# Patient Record
Sex: Female | Born: 1960 | Race: Black or African American | Hispanic: No | Marital: Single | Smoking: Never smoker
Health system: Southern US, Community
[De-identification: ages and names within clinical notes are randomized; demographics above are authoritative.]

## PROBLEM LIST (undated history)

## (undated) DIAGNOSIS — M25569 Pain in unspecified knee: Secondary | ICD-10-CM

## (undated) DIAGNOSIS — Z8639 Personal history of other endocrine, nutritional and metabolic disease: Secondary | ICD-10-CM

## (undated) DIAGNOSIS — B9689 Other specified bacterial agents as the cause of diseases classified elsewhere: Secondary | ICD-10-CM

## (undated) DIAGNOSIS — M79606 Pain in leg, unspecified: Secondary | ICD-10-CM

## (undated) DIAGNOSIS — B3731 Acute candidiasis of vulva and vagina: Secondary | ICD-10-CM

## (undated) DIAGNOSIS — T7840XA Allergy, unspecified, initial encounter: Secondary | ICD-10-CM

## (undated) DIAGNOSIS — M549 Dorsalgia, unspecified: Secondary | ICD-10-CM

## (undated) DIAGNOSIS — Z86010 Personal history of colonic polyps: Secondary | ICD-10-CM

## (undated) DIAGNOSIS — N760 Acute vaginitis: Secondary | ICD-10-CM

## (undated) DIAGNOSIS — E785 Hyperlipidemia, unspecified: Secondary | ICD-10-CM

## (undated) DIAGNOSIS — D649 Anemia, unspecified: Secondary | ICD-10-CM

## (undated) DIAGNOSIS — B373 Candidiasis of vulva and vagina: Secondary | ICD-10-CM

## (undated) DIAGNOSIS — M4800 Spinal stenosis, site unspecified: Secondary | ICD-10-CM

## (undated) DIAGNOSIS — M1990 Unspecified osteoarthritis, unspecified site: Secondary | ICD-10-CM

## (undated) HISTORY — DX: Unspecified osteoarthritis, unspecified site: M19.90

## (undated) HISTORY — DX: Spinal stenosis, site unspecified: M48.00

## (undated) HISTORY — PX: COLONOSCOPY: SHX174

## (undated) HISTORY — DX: Other specified bacterial agents as the cause of diseases classified elsewhere: N76.0

## (undated) HISTORY — PX: WISDOM TOOTH EXTRACTION: SHX21

## (undated) HISTORY — DX: Dorsalgia, unspecified: M54.9

## (undated) HISTORY — DX: Personal history of other endocrine, nutritional and metabolic disease: Z86.39

## (undated) HISTORY — DX: Acute candidiasis of vulva and vagina: B37.31

## (undated) HISTORY — DX: Candidiasis of vulva and vagina: B37.3

## (undated) HISTORY — DX: Allergy, unspecified, initial encounter: T78.40XA

## (undated) HISTORY — DX: Personal history of colonic polyps: Z86.010

## (undated) HISTORY — PX: POLYPECTOMY: SHX149

## (undated) HISTORY — DX: Pain in leg, unspecified: M79.606

## (undated) HISTORY — DX: Other specified bacterial agents as the cause of diseases classified elsewhere: B96.89

## (undated) HISTORY — DX: Pain in unspecified knee: M25.569

## (undated) HISTORY — DX: Hyperlipidemia, unspecified: E78.5

## (undated) HISTORY — DX: Anemia, unspecified: D64.9

---

## 2011-05-23 ENCOUNTER — Other Ambulatory Visit: Payer: Self-pay | Admitting: Internal Medicine

## 2011-05-23 DIAGNOSIS — Z1231 Encounter for screening mammogram for malignant neoplasm of breast: Secondary | ICD-10-CM

## 2011-06-13 ENCOUNTER — Ambulatory Visit
Admission: RE | Admit: 2011-06-13 | Discharge: 2011-06-13 | Disposition: A | Discharge status: Home / Self Care | Payer: 59 | Source: Ambulatory Visit | Attending: Internal Medicine | Admitting: Internal Medicine

## 2011-06-13 DIAGNOSIS — Z1231 Encounter for screening mammogram for malignant neoplasm of breast: Secondary | ICD-10-CM

## 2012-03-17 ENCOUNTER — Telehealth: Payer: Self-pay | Admitting: Obstetrics and Gynecology

## 2012-03-17 NOTE — Telephone Encounter (Signed)
Triage/appt

## 2012-03-17 NOTE — Telephone Encounter (Signed)
Lm on vm tc rgd msg 

## 2012-03-19 ENCOUNTER — Encounter: Payer: Self-pay | Admitting: Obstetrics and Gynecology

## 2012-03-19 ENCOUNTER — Ambulatory Visit (INDEPENDENT_AMBULATORY_CARE_PROVIDER_SITE_OTHER): Payer: BC Managed Care – PPO | Admitting: Obstetrics and Gynecology

## 2012-03-19 VITALS — BP 92/60 | Ht 64.0 in | Wt 163.0 lb

## 2012-03-19 DIAGNOSIS — A499 Bacterial infection, unspecified: Secondary | ICD-10-CM

## 2012-03-19 DIAGNOSIS — N898 Other specified noninflammatory disorders of vagina: Secondary | ICD-10-CM

## 2012-03-19 DIAGNOSIS — B9689 Other specified bacterial agents as the cause of diseases classified elsewhere: Secondary | ICD-10-CM

## 2012-03-19 DIAGNOSIS — L293 Anogenital pruritus, unspecified: Secondary | ICD-10-CM

## 2012-03-19 DIAGNOSIS — N760 Acute vaginitis: Secondary | ICD-10-CM

## 2012-03-19 LAB — POCT WET PREP (WET MOUNT)
Clue Cells Wet Prep Whiff POC: NEGATIVE
WBC, Wet Prep HPF POC: NEGATIVE
pH: 4

## 2012-03-19 MED ORDER — METRONIDAZOLE 500 MG PO TABS: 500.0000 mg | ORAL_TABLET | Freq: Two times a day (BID) | ORAL | Status: AC

## 2012-03-19 NOTE — Progress Notes (Signed)
S: Patient presented with hx of vaginal irritation since commencing exercising a few weeks. O: Normal affect, alert and oriented x 3      Lungs: Bilaterally Clear      CV: RRR      Abdomen soft and non tender all 4 quadrants      Bowel sound: normal      Bowel motions: regular      U/A: Neg      Speculum Examination: External Genitalia: normal, Vaginal wall pink and perfused not irritation, Cx pink no inflammation.      Wet Prep: Ph 4.0 , clue cells = BV P. Tx Flagyl 500mg s po x 7 days     Return PRN

## 2012-04-01 ENCOUNTER — Ambulatory Visit (INDEPENDENT_AMBULATORY_CARE_PROVIDER_SITE_OTHER): Payer: BC Managed Care – PPO | Admitting: Obstetrics and Gynecology

## 2012-04-01 ENCOUNTER — Encounter: Payer: Self-pay | Admitting: Obstetrics and Gynecology

## 2012-04-01 VITALS — BP 100/62 | Wt 162.0 lb

## 2012-04-01 DIAGNOSIS — B373 Candidiasis of vulva and vagina: Secondary | ICD-10-CM

## 2012-04-01 DIAGNOSIS — N898 Other specified noninflammatory disorders of vagina: Secondary | ICD-10-CM

## 2012-04-01 LAB — POCT WET PREP (WET MOUNT)
Whiff Test: NEGATIVE
pH: 4.5

## 2012-04-01 MED ORDER — TERCONAZOLE 0.4 % VA CREA: 1.0000 | TOPICAL_CREAM | Freq: Every day | VAGINAL | Status: AC

## 2012-04-01 NOTE — Progress Notes (Signed)
Color: WHITE Odor: no Itching:yes Thin:yes Thick:no Fever:no Dyspareunia:no Hx PID:no HX STD:no Pelvic Pain:no Desires Gc/CT:no Desires HIV,RPR,HbsAG:no

## 2012-04-01 NOTE — Progress Notes (Signed)
51 YO recently treated for BV returns with complaints of vaginal itching states she's had these symptoms since before her treatment for  BV.  O: Pelvic: EGBUS-mild erythema,  vagina-large clumpy yellow discharge, cervix-no lesions, uterus-normal size, adnexae-no tenderness      Wet prep:  pH-4.5   whiff-negative  yeast +++  A: Candida Vaginitis     Recent BV Treatment     H/O Vitamin D Deficiency  P:  Terazol 7 Vaginal Cream # 1 tube 1 appl. pv qhs x 7 days no refills       Vitamin D 1000 IU qd       Perineal hygiene      RTO-as scheduled or prn  Milderd Manocchio, PA-C

## 2012-05-25 ENCOUNTER — Encounter: Payer: Self-pay | Admitting: Obstetrics and Gynecology

## 2012-05-25 ENCOUNTER — Ambulatory Visit (INDEPENDENT_AMBULATORY_CARE_PROVIDER_SITE_OTHER): Payer: BC Managed Care – PPO | Admitting: Obstetrics and Gynecology

## 2012-05-25 ENCOUNTER — Telehealth: Payer: Self-pay | Admitting: Obstetrics and Gynecology

## 2012-05-25 VITALS — BP 90/70 | Wt 162.5 lb

## 2012-05-25 DIAGNOSIS — B9689 Other specified bacterial agents as the cause of diseases classified elsewhere: Secondary | ICD-10-CM

## 2012-05-25 DIAGNOSIS — A499 Bacterial infection, unspecified: Secondary | ICD-10-CM

## 2012-05-25 DIAGNOSIS — N898 Other specified noninflammatory disorders of vagina: Secondary | ICD-10-CM

## 2012-05-25 DIAGNOSIS — B373 Candidiasis of vulva and vagina: Secondary | ICD-10-CM

## 2012-05-25 DIAGNOSIS — N760 Acute vaginitis: Secondary | ICD-10-CM

## 2012-05-25 LAB — POCT WET PREP (WET MOUNT)

## 2012-05-25 LAB — POCT OSOM BVBLUE TEST: Bacterial Vaginosis: POSITIVE

## 2012-05-25 MED ORDER — NONFORMULARY OR COMPOUNDED ITEM: Status: DC

## 2012-05-25 MED ORDER — FLUCONAZOLE 150 MG PO TABS: 150.0000 mg | ORAL_TABLET | Freq: Once | ORAL | Status: DC

## 2012-05-25 MED ORDER — TINIDAZOLE 500 MG PO TABS: ORAL_TABLET | ORAL | Status: DC

## 2012-05-25 NOTE — Progress Notes (Signed)
Vaginal discharge: clearwatery Itching / Burning: yes Fever: no  Symptoms have been present for several days. Has used over-the-counter treatment: no. Was seen previously and given rx. Associated symptoms:  Pelvic pain: no       Dyspareunia: no     Odor:  none  History of STD:  no history of PID, STD's STD screen no  Vaginal Discharge/Discomfort/Itching  Subjective:   Sherine Cortese is an 51 y.o. woman who presents c/o vaginal itching ongoing for many weeks. Has seen Dr Su Hilt and was treated with PO ATB. Then was seen by EP and was treated for yeast with vaginal cream. Symptoms are persistent. Hygiene reviewed: uses Always pantyliner daily  Objective: discharge Thick and white Vaginal discharge: pH 5.5 Vaginal lesions:  none  Wet prep: hyphae +++ OSOM BV: positive OSOM Trichomonas:  not done  Assessment: Yeast vaginitis and BV  Plan: Diflucan 150 mg today, Thursday and next week Tindamax Boric acid suppository 2xweekly for 4 weeks Continue probiotics Counseling: D/C Always  Follow-up: PRN  Ranessa Kosta A MD 05/25/2012 1:33 PM

## 2012-05-25 NOTE — Telephone Encounter (Signed)
Spoke with pt rgd msg pt c/o vaginal itching and irritation wants eval today pt has appt 05/25/12 at 12:40 with SR pt voice understanding

## 2012-05-25 NOTE — Progress Notes (Deleted)
Subjective:    Angela Koch is a 51 y.o. female, G3P0, who presents for vaginal itching and irritation. Seen recently for same symptoms but states never really went away.    The following portions of the patient's history were reviewed and updated as appropriate: allergies, current medications, past family history.  Review of Systems Pertinent items are noted in HPI. Breast:Negative for breast lump,nipple discharge or nipple retraction Gastrointestinal: Negative for abdominal pain, change in bowel habits or rectal bleeding Urinary:negative   Objective:    BP 90/70  Wt 162 lb 8 oz (73.71 kg)  LMP 05/14/2012    Weight:  Wt Readings from Last 1 Encounters:  05/25/12 162 lb 8 oz (73.71 kg)          BMI: There is no height on file to calculate BMI.  General Appearance: Alert, appropriate appearance for age. No acute distress GYN exam:   Assessment:    {diagnoses; exam gyn:13148}    Plan:    {gyn plan:315269::"mammogram","pap smear","return annually or prn"}     Butler Denmark PMD

## 2012-06-23 ENCOUNTER — Ambulatory Visit (INDEPENDENT_AMBULATORY_CARE_PROVIDER_SITE_OTHER): Payer: BC Managed Care – PPO | Admitting: Obstetrics and Gynecology

## 2012-06-23 ENCOUNTER — Encounter: Payer: Self-pay | Admitting: Obstetrics and Gynecology

## 2012-06-23 VITALS — BP 114/68 | Resp 16 | Ht 64.0 in | Wt 157.0 lb

## 2012-06-23 DIAGNOSIS — D219 Benign neoplasm of connective and other soft tissue, unspecified: Secondary | ICD-10-CM

## 2012-06-23 DIAGNOSIS — Z01419 Encounter for gynecological examination (general) (routine) without abnormal findings: Secondary | ICD-10-CM

## 2012-06-23 DIAGNOSIS — Z124 Encounter for screening for malignant neoplasm of cervix: Secondary | ICD-10-CM

## 2012-06-23 DIAGNOSIS — D259 Leiomyoma of uterus, unspecified: Secondary | ICD-10-CM

## 2012-06-23 NOTE — Progress Notes (Signed)
Patient ID: Arline Ketter, female   DOB: [birth year], 51 y.o.   MRN: [id number] Contraception partner vasectomy Last pap 10/2012wnl Last Mammo 05/2011 wnl per pt Last Colonoscopy never Last Dexa Scan never Primary MD Dr. Allyne Gee Abuse at Home none  No complaints  Filed Vitals:   06/23/12 1007  BP: 114/68  Resp: 16   ROS: noncontributory  Physical Examination: General appearance - alert, well appearing, and in no distress Neck - supple, no significant adenopathy Chest - clear to auscultation, no wheezes, rales or rhonchi, symmetric air entry Heart - normal rate and regular rhythm Abdomen - soft, nontender, nondistended, no masses or organomegaly Breasts - breasts appear normal, no suspicious masses, no skin or nipple changes or axillary nodes Pelvic - normal external genitalia, vulva, vagina, cervix, bulky uterus and adnexa ? Secondary to difficult to differentiate b/n fibroids and ovaries Back exam - no CVAT Extremities - no edema, redness or tenderness in the calves or thighs  A/P AEX in 25yr Next available for pelvic u/s secondary to fibroids and eval ovaries Needs mammo - pt says she will schedule Screening colonoscopy starting next yr

## 2012-06-24 LAB — PAP IG W/ RFLX HPV ASCU

## 2012-07-28 ENCOUNTER — Ambulatory Visit (INDEPENDENT_AMBULATORY_CARE_PROVIDER_SITE_OTHER): Payer: BC Managed Care – PPO | Admitting: Obstetrics and Gynecology

## 2012-07-28 ENCOUNTER — Ambulatory Visit (INDEPENDENT_AMBULATORY_CARE_PROVIDER_SITE_OTHER): Payer: BC Managed Care – PPO

## 2012-07-28 ENCOUNTER — Encounter: Payer: Self-pay | Admitting: Obstetrics and Gynecology

## 2012-07-28 ENCOUNTER — Other Ambulatory Visit: Payer: Self-pay | Admitting: Obstetrics and Gynecology

## 2012-07-28 VITALS — BP 110/70 | Ht 64.0 in | Wt 167.0 lb

## 2012-07-28 DIAGNOSIS — D518 Other vitamin B12 deficiency anemias: Secondary | ICD-10-CM

## 2012-07-28 DIAGNOSIS — N83209 Unspecified ovarian cyst, unspecified side: Secondary | ICD-10-CM

## 2012-07-28 DIAGNOSIS — D219 Benign neoplasm of connective and other soft tissue, unspecified: Secondary | ICD-10-CM

## 2012-07-28 DIAGNOSIS — E559 Vitamin D deficiency, unspecified: Secondary | ICD-10-CM

## 2012-07-28 DIAGNOSIS — D259 Leiomyoma of uterus, unspecified: Secondary | ICD-10-CM

## 2012-07-28 DIAGNOSIS — Z139 Encounter for screening, unspecified: Secondary | ICD-10-CM

## 2012-07-28 LAB — CBC
MCHC: 31.4 g/dL (ref 30.0–36.0)
RDW: 16.8 % — ABNORMAL HIGH (ref 11.5–15.5)

## 2012-07-28 LAB — TSH: TSH: 0.977 u[IU]/mL (ref 0.350–4.500)

## 2012-07-28 NOTE — Addendum Note (Signed)
Addended by: Marla Roe A on: 07/28/2012 12:20 PM   Modules accepted: Orders

## 2012-07-28 NOTE — Progress Notes (Signed)
Here to f/u u/s.  She reports dysmenorrhea controlled with ibuprofen.  Filed Vitals:   07/28/12 1121  BP: 110/70   U/S - Ut 13.4cm x 8.4cm x 9.9cm, nl lt ovary , rt ovary with simple cyst 3.5cm, 4 fibroids (2.8cm, 5.0cm, 5.4cm, 3.7cm)  A/P Fibroids - asymptomatic will cont to take ibuprofen U/s in to f/u ovarian cyst Labs today - cbc, tsh, vit D (reports h/o vit D def)

## 2012-07-29 LAB — VITAMIN D 25 HYDROXY (VIT D DEFICIENCY, FRACTURES): Vit D, 25-Hydroxy: 15 ng/mL — ABNORMAL LOW (ref 30–89)

## 2012-08-19 ENCOUNTER — Encounter: Payer: Self-pay | Admitting: Obstetrics and Gynecology

## 2012-08-19 ENCOUNTER — Telehealth: Payer: Self-pay | Admitting: Obstetrics and Gynecology

## 2012-08-19 ENCOUNTER — Ambulatory Visit (INDEPENDENT_AMBULATORY_CARE_PROVIDER_SITE_OTHER): Payer: BC Managed Care – PPO | Admitting: Obstetrics and Gynecology

## 2012-08-19 VITALS — BP 112/70 | HR 72 | Wt 164.0 lb

## 2012-08-19 DIAGNOSIS — N898 Other specified noninflammatory disorders of vagina: Secondary | ICD-10-CM

## 2012-08-19 MED ORDER — METRONIDAZOLE 500 MG PO TABS: ORAL_TABLET | ORAL | Status: DC

## 2012-08-19 MED ORDER — ERGOCALCIFEROL 1.25 MG (50000 UT) PO CAPS: 50000.0000 [IU] | ORAL_CAPSULE | ORAL | Status: DC

## 2012-08-19 MED ORDER — VITAMIN D3 1.25 MG (50000 UT) PO CAPS: 1.0000 | ORAL_CAPSULE | ORAL | Status: DC

## 2012-08-19 MED ORDER — FLUCONAZOLE 150 MG PO TABS: 150.0000 mg | ORAL_TABLET | Freq: Once | ORAL | Status: AC

## 2012-08-19 NOTE — Progress Notes (Signed)
Vaginal discharge: brownthin mucoid, thick mucoid Itching / Burning: yes Fever: no  Symptoms have been present for 2 days. PT STATES SHE HAS BEEN HAVING RECURRENT YEAST INFECTIONS SINCE July Has used over-the-counter treatment: no Associated symptoms:  Pelvic pain: no       Dyspareunia: no     Odor:  yes  History of STD:  no history of PID, STD's STD screen:declined

## 2012-08-19 NOTE — Patient Instructions (Addendum)
Take an Iron supplement twice a day for 6 weeks  After 8 weeks of taking the Vitamin D, return to have your Vitamin D rechecked,  if it is then normal,  you may be able to maintain your Vitamin D level (once normal) with 1000 IU of Vtiamin D daily

## 2012-08-19 NOTE — Progress Notes (Signed)
51 YO complains of recurrent vaginitis symptoms since July. Treatments have seemingly worked until after her menses, then symptoms recur. Patient is anemic and has a low vitamin D   O:  Pelvic: EGBUS-wnl, vagina-moderate grey discharge, cervix/uterus/adnexae-normal  Wet Prep:  pH-5.0,  whiff-negative, positive clue cells,  OSOM-(trich)-negative  A: Bacterial Vaginosis-recurrent vs persisten     Vitamin D Deficiency     Anemia  P:  Metronidazole 500 mg  #26 bid x 7 days then once weekly x 12 weeks no refills       Perineal hygiene       OTC  iron supplementation bid x 6 weeks      Vitamin D 50,000 units twice weekly x 8 weeks       Repeat Vitamin D 25-H & CBC in 8 weeks       Diflucan 150 mg #1 1 po stat 1 refill       RTO-as scheduled or prn      Dejour Vos, PA-C

## 2012-08-24 ENCOUNTER — Telehealth: Payer: Self-pay

## 2012-08-24 ENCOUNTER — Telehealth: Payer: Self-pay | Admitting: Obstetrics and Gynecology

## 2012-08-24 MED ORDER — FLUCONAZOLE 150 MG PO TABS: 150.0000 mg | ORAL_TABLET | Freq: Once | ORAL | Status: DC

## 2012-08-24 NOTE — Telephone Encounter (Signed)
Spoke to pt. Re: Vitamin D. Level of 15. She states she thought she was supposed to be taking her Vitamin D 1 q week x 12 weeks, although the way it was prescribed was twice weekly x 8 weeks. I also reminded pt to be taking iron bid, which she says she is. Melody Comas A

## 2012-08-24 NOTE — Telephone Encounter (Signed)
Pt saw EP on 08/19/12

## 2012-08-24 NOTE — Telephone Encounter (Signed)
Tc to pt concerning rx Diflucan. Pt states she lost her rx and need another one call in. Will call rx to pt Pharmacy.

## 2012-10-13 ENCOUNTER — Telehealth: Payer: Self-pay | Admitting: Obstetrics and Gynecology

## 2012-10-13 ENCOUNTER — Other Ambulatory Visit: Payer: Self-pay | Admitting: Obstetrics and Gynecology

## 2012-10-13 DIAGNOSIS — Z1231 Encounter for screening mammogram for malignant neoplasm of breast: Secondary | ICD-10-CM

## 2012-10-13 NOTE — Telephone Encounter (Signed)
Ar pt 

## 2012-10-18 ENCOUNTER — Ambulatory Visit
Admission: RE | Admit: 2012-10-18 | Discharge: 2012-10-18 | Disposition: A | Discharge status: Home / Self Care | Payer: BC Managed Care – PPO | Source: Ambulatory Visit | Attending: Obstetrics and Gynecology | Admitting: Obstetrics and Gynecology

## 2012-10-18 DIAGNOSIS — Z1231 Encounter for screening mammogram for malignant neoplasm of breast: Secondary | ICD-10-CM

## 2012-10-19 ENCOUNTER — Other Ambulatory Visit: Payer: Self-pay

## 2012-10-19 DIAGNOSIS — E559 Vitamin D deficiency, unspecified: Secondary | ICD-10-CM

## 2012-10-19 NOTE — Telephone Encounter (Signed)
Spoke to pt about getting her scheduled for recheck Vitamin D level. Angela Koch A

## 2012-10-22 ENCOUNTER — Other Ambulatory Visit: Payer: BC Managed Care – PPO

## 2012-10-22 DIAGNOSIS — E559 Vitamin D deficiency, unspecified: Secondary | ICD-10-CM

## 2012-10-23 LAB — VITAMIN D 25 HYDROXY (VIT D DEFICIENCY, FRACTURES): Vit D, 25-Hydroxy: 24 ng/mL — ABNORMAL LOW (ref 30–89)

## 2012-11-01 ENCOUNTER — Telehealth: Payer: Self-pay

## 2012-11-01 ENCOUNTER — Other Ambulatory Visit: Payer: Self-pay

## 2012-11-01 DIAGNOSIS — E559 Vitamin D deficiency, unspecified: Secondary | ICD-10-CM

## 2012-11-01 MED ORDER — VITAMIN D3 1.25 MG (50000 UT) PO CAPS: 1.0000 | ORAL_CAPSULE | ORAL | Status: DC

## 2012-11-01 NOTE — Telephone Encounter (Signed)
Called pt to let her know that Vitamin D Rx has been refilled. She should now take it once a week and we will recheck her in 12 weeks. Pt understands. Recall entered and future order entered. Melody Comas A

## 2012-11-02 ENCOUNTER — Telehealth: Payer: Self-pay

## 2012-11-02 NOTE — Telephone Encounter (Signed)
Spoke to pharmacist to say that it is ok to substitute Vit D3 with Vit D2 50,000 units. , per AR. Melody Comas A

## 2013-07-13 ENCOUNTER — Other Ambulatory Visit: Payer: Self-pay | Admitting: Family Medicine

## 2013-07-13 DIAGNOSIS — N6310 Unspecified lump in the right breast, unspecified quadrant: Secondary | ICD-10-CM

## 2013-07-27 ENCOUNTER — Ambulatory Visit
Admission: RE | Admit: 2013-07-27 | Discharge: 2013-07-27 | Disposition: A | Discharge status: Home / Self Care | Payer: BC Managed Care – PPO | Source: Ambulatory Visit | Attending: Family Medicine | Admitting: Family Medicine

## 2013-07-27 DIAGNOSIS — N6310 Unspecified lump in the right breast, unspecified quadrant: Secondary | ICD-10-CM

## 2013-11-01 ENCOUNTER — Other Ambulatory Visit: Payer: Self-pay

## 2013-11-01 DIAGNOSIS — Z1231 Encounter for screening mammogram for malignant neoplasm of breast: Secondary | ICD-10-CM

## 2013-11-11 ENCOUNTER — Ambulatory Visit
Admission: RE | Admit: 2013-11-11 | Discharge: 2013-11-11 | Disposition: A | Discharge status: Home / Self Care | Payer: BC Managed Care – PPO | Source: Ambulatory Visit

## 2013-11-11 DIAGNOSIS — Z1231 Encounter for screening mammogram for malignant neoplasm of breast: Secondary | ICD-10-CM

## 2014-01-04 ENCOUNTER — Encounter: Payer: BC Managed Care – PPO | Admitting: Gastroenterology

## 2014-01-17 ENCOUNTER — Ambulatory Visit (AMBULATORY_SURGERY_CENTER): Payer: Self-pay

## 2014-01-17 VITALS — Ht 64.0 in | Wt 179.8 lb

## 2014-01-17 DIAGNOSIS — Z1211 Encounter for screening for malignant neoplasm of colon: Secondary | ICD-10-CM

## 2014-01-17 MED ORDER — NA SULFATE-K SULFATE-MG SULF 17.5-3.13-1.6 GM/177ML PO SOLN: ORAL | Status: DC

## 2014-01-17 NOTE — Progress Notes (Signed)
Per pt, allergic to soy milk-causes nose to itch.No allergies to egg products.Pt not taking any weight loss meds or using  O2 at home.Pt has never been sedated before.

## 2014-01-19 ENCOUNTER — Encounter: Payer: Self-pay | Admitting: Internal Medicine

## 2014-01-31 ENCOUNTER — Encounter: Payer: Self-pay | Admitting: Internal Medicine

## 2014-01-31 ENCOUNTER — Ambulatory Visit (AMBULATORY_SURGERY_CENTER): Payer: BC Managed Care – PPO | Admitting: Internal Medicine

## 2014-01-31 VITALS — BP 110/73 | HR 48 | Temp 96.1°F | Resp 18 | Ht 64.0 in | Wt 179.0 lb

## 2014-01-31 DIAGNOSIS — D126 Benign neoplasm of colon, unspecified: Secondary | ICD-10-CM

## 2014-01-31 DIAGNOSIS — K5730 Diverticulosis of large intestine without perforation or abscess without bleeding: Secondary | ICD-10-CM

## 2014-01-31 DIAGNOSIS — Z860100 Personal history of colon polyps, unspecified: Secondary | ICD-10-CM

## 2014-01-31 DIAGNOSIS — Z1211 Encounter for screening for malignant neoplasm of colon: Secondary | ICD-10-CM

## 2014-01-31 HISTORY — DX: Personal history of colon polyps, unspecified: Z86.0100

## 2014-01-31 HISTORY — DX: Personal history of colonic polyps: Z86.010

## 2014-01-31 MED ORDER — SODIUM CHLORIDE 0.9 % IV SOLN: 500.0000 mL | INTRAVENOUS | Status: DC

## 2014-01-31 NOTE — Progress Notes (Signed)
Stable to RR 

## 2014-01-31 NOTE — Progress Notes (Signed)
Called to room to assist during endoscopic procedure.  Patient ID and intended procedure confirmed with present staff. Received instructions for my participation in the procedure from the performing physician.  

## 2014-01-31 NOTE — Op Note (Signed)
[place]  [address] [place], [address]   COLONOSCOPY PROCEDURE REPORT  PATIENT: Angela Koch, Angela Koch  MR#: [id number] BIRTHDATE: Nov 16, 1960 , 53  yrs. old GENDER: Female ENDOSCOPIST: Gatha Mayer, MD, [facility] REFERRED FE:OFHQR [facility], M.D. PROCEDURE DATE:  01/31/2014 PROCEDURE:   Colonoscopy with biopsy First Screening Colonoscopy - Avg.  risk and is 50 yrs.  old or older Yes.  Prior Negative Screening - Now for repeat screening. N/A  History of Adenoma - Now for follow-up colonoscopy & has been > or = to 3 yrs.  N/A  Polyps Removed Today? Yes. ASA CLASS:   Class I INDICATIONS:average risk screening and first colonoscopy. MEDICATIONS: propofol (Diprivan) 250mg  IV, MAC sedation, administered by CRNA, and These medications were titrated to patient response per physician's verbal order  DESCRIPTION OF PROCEDURE:   After the risks benefits and alternatives of the procedure were thoroughly explained, informed consent was obtained.  A digital rectal exam revealed no abnormalities of the rectum.   The LB [id number]  endoscope was introduced through the anus and advanced to the cecum, which was identified by both the appendix and ileocecal valve. No adverse events experienced.   The quality of the prep was excellent using Suprep  The instrument was then slowly withdrawn as the colon was fully examined.  COLON FINDINGS: A sessile polyp measuring 3 mm in size was found at the ileocecal valve.  A polypectomy was performed with cold forceps.  The resection was complete and the polyp tissue was completely retrieved.   There was mild scattered diverticulosis noted throughout the entire examined colon.   The colon mucosa was otherwise normal.   A right colon retroflexion was performed. Retroflexed views revealed no abnormalities. The time to cecum=4 minutes 39 seconds.  Withdrawal time=11 minutes 42 seconds.  The scope was withdrawn and the procedure  completed. COMPLICATIONS: There were no complications.  ENDOSCOPIC IMPRESSION: 1.   Sessile polyp measuring 3 mm in size was found at the ileocecal valve; polypectomy was performed with cold forceps 2.   There was mild diverticulosis noted throughout the entire examined colon 3.   The colon mucosa was otherwise normal - excellent prep - first colonoscopy  RECOMMENDATIONS: Timing of repeat colonoscopy will be determined by pathology findings.   eSigned:  Gatha Mayer, MD, [facility] 01/31/2014 8:36 AM   cc: Glendale Chard, MD and The Patient

## 2014-01-31 NOTE — Patient Instructions (Addendum)
I found and removed one tiny polyp. It looks benign.  You also have a condition called diverticulosis - common and not usually a problem. Please read the handout provided.  I will let you know pathology results and when to have another routine colonoscopy by mail.  I appreciate the opportunity to care for you. Gatha Mayer, MD, [facility]   Discharge instructions given with verbal understanding. Handouts on polyps and diverticulosis. Resume previous medications. YOU HAD AN ENDOSCOPIC PROCEDURE TODAY AT [facility]: Refer to the procedure report that was given to you for any specific questions about what was found during the examination.  If the procedure report does not answer your questions, please call your gastroenterologist to clarify.  If you requested that your care partner not be given the details of your procedure findings, then the procedure report has been included in a sealed envelope for you to review at your convenience later.  YOU SHOULD EXPECT: Some feelings of bloating in the abdomen. Passage of more gas than usual.  Walking can help get rid of the air that was put into your GI tract during the procedure and reduce the bloating. If you had a lower endoscopy (such as a colonoscopy or flexible sigmoidoscopy) you may notice spotting of blood in your stool or on the toilet paper. If you underwent a bowel prep for your procedure, then you may not have a normal bowel movement for a few days.  DIET: Your first meal following the procedure should be a light meal and then it is ok to progress to your normal diet.  A half-sandwich or bowl of soup is an example of a good first meal.  Heavy or fried foods are harder to digest and may make you feel nauseous or bloated.  Likewise meals heavy in dairy and vegetables can cause extra gas to form and this can also increase the bloating.  Drink plenty of fluids but you should avoid alcoholic beverages for 24 hours.  ACTIVITY: Your care  partner should take you home directly after the procedure.  You should plan to take it easy, moving slowly for the rest of the day.  You can resume normal activity the day after the procedure however you should NOT DRIVE or use heavy machinery for 24 hours (because of the sedation medicines used during the test).    SYMPTOMS TO REPORT IMMEDIATELY: A gastroenterologist can be reached at any hour.  During normal business hours, 8:30 AM to 5:00 PM Monday through Friday, call [phone].  After hours and on weekends, please call the GI answering service at [phone] who will take a message and have the physician on call contact you.   Following lower endoscopy (colonoscopy or flexible sigmoidoscopy):  Excessive amounts of blood in the stool  Significant tenderness or worsening of abdominal pains  Swelling of the abdomen that is new, acute  Fever of 100F or higher  FOLLOW UP: If any biopsies were taken you will be contacted by phone or by letter within the next 1-3 weeks.  Call your gastroenterologist if you have not heard about the biopsies in 3 weeks.  Our staff will call the home number listed on your records the next business day following your procedure to check on you and address any questions or concerns that you may have at that time regarding the information given to you following your procedure. This is a courtesy call and so if there is no answer at the home number  and we have not heard from you through the emergency physician on call, we will assume that you have returned to your regular daily activities without incident.  SIGNATURES/CONFIDENTIALITY: You and/or your care partner have signed paperwork which will be entered into your electronic medical record.  These signatures attest to the fact that that the information above on your After Visit Summary has been reviewed and is understood.  Full responsibility of the confidentiality of this discharge information lies with you and/or  your care-partner.

## 2014-02-01 ENCOUNTER — Telehealth: Payer: Self-pay

## 2014-02-01 NOTE — Telephone Encounter (Signed)
  Follow up Call-  Call back number 01/31/2014  Post procedure Call Back phone  # [phone]  Permission to leave phone message Yes     Patient questions:  Do you have a fever, pain , or abdominal swelling? no Pain Score  0 *  Have you tolerated food without any problems? yes  Have you been able to return to your normal activities? yes  Do you have any questions about your discharge instructions: Diet   no Medications  no Follow up visit  no  Do you have questions or concerns about your Care? no  Actions: * If pain score is 4 or above: No action needed, pain <4.   Per the pt she c/o nasal congestion yesterday and this am.  I advised her that we administer O2 via nasal canula and sometimes pt do c/o nasal congestion after the procedure.  Pt said it could be allergies too.  She hoped it was not a cold.  I told her common cold was going around.Marland Kitchenibuprofen had a cold last week.  To call back if needed. Maw

## 2014-02-07 ENCOUNTER — Encounter: Payer: Self-pay | Admitting: Internal Medicine

## 2014-02-07 NOTE — Progress Notes (Signed)
Quick Note:  3 mm tubular adenoma Repeat colonoscopy 2020 ______

## 2014-06-26 ENCOUNTER — Encounter: Payer: Self-pay | Admitting: Internal Medicine

## 2014-07-14 ENCOUNTER — Other Ambulatory Visit: Payer: Self-pay | Admitting: Obstetrics and Gynecology

## 2015-01-25 ENCOUNTER — Other Ambulatory Visit: Payer: Self-pay

## 2015-01-25 DIAGNOSIS — Z1231 Encounter for screening mammogram for malignant neoplasm of breast: Secondary | ICD-10-CM

## 2015-01-31 ENCOUNTER — Ambulatory Visit
Admission: RE | Admit: 2015-01-31 | Discharge: 2015-01-31 | Disposition: A | Discharge status: Home / Self Care | Payer: BLUE CROSS/BLUE SHIELD | Source: Ambulatory Visit

## 2015-01-31 DIAGNOSIS — Z1231 Encounter for screening mammogram for malignant neoplasm of breast: Secondary | ICD-10-CM

## 2015-02-02 ENCOUNTER — Other Ambulatory Visit: Payer: Self-pay | Admitting: Obstetrics and Gynecology

## 2015-02-02 DIAGNOSIS — R928 Other abnormal and inconclusive findings on diagnostic imaging of breast: Secondary | ICD-10-CM

## 2015-02-09 ENCOUNTER — Ambulatory Visit
Admission: RE | Admit: 2015-02-09 | Discharge: 2015-02-09 | Disposition: A | Discharge status: Home / Self Care | Payer: 59 | Source: Ambulatory Visit | Attending: Obstetrics and Gynecology | Admitting: Obstetrics and Gynecology

## 2015-02-09 DIAGNOSIS — R928 Other abnormal and inconclusive findings on diagnostic imaging of breast: Secondary | ICD-10-CM

## 2016-02-14 ENCOUNTER — Other Ambulatory Visit: Payer: Self-pay | Admitting: Obstetrics and Gynecology

## 2016-02-14 DIAGNOSIS — Z1231 Encounter for screening mammogram for malignant neoplasm of breast: Secondary | ICD-10-CM

## 2016-02-21 ENCOUNTER — Ambulatory Visit
Admission: RE | Admit: 2016-02-21 | Discharge: 2016-02-21 | Disposition: A | Discharge status: Home / Self Care | Payer: 59 | Source: Ambulatory Visit | Attending: Obstetrics and Gynecology | Admitting: Obstetrics and Gynecology

## 2016-02-21 DIAGNOSIS — Z1231 Encounter for screening mammogram for malignant neoplasm of breast: Secondary | ICD-10-CM

## 2016-02-27 ENCOUNTER — Other Ambulatory Visit: Payer: Self-pay | Admitting: Obstetrics and Gynecology

## 2016-02-27 DIAGNOSIS — R928 Other abnormal and inconclusive findings on diagnostic imaging of breast: Secondary | ICD-10-CM

## 2016-03-04 ENCOUNTER — Ambulatory Visit
Admission: RE | Admit: 2016-03-04 | Discharge: 2016-03-04 | Disposition: A | Discharge status: Home / Self Care | Payer: 59 | Source: Ambulatory Visit | Attending: Obstetrics and Gynecology | Admitting: Obstetrics and Gynecology

## 2016-03-04 DIAGNOSIS — R928 Other abnormal and inconclusive findings on diagnostic imaging of breast: Secondary | ICD-10-CM

## 2017-06-24 ENCOUNTER — Other Ambulatory Visit: Payer: Self-pay | Admitting: Obstetrics and Gynecology

## 2017-06-24 DIAGNOSIS — Z1231 Encounter for screening mammogram for malignant neoplasm of breast: Secondary | ICD-10-CM

## 2017-07-22 ENCOUNTER — Ambulatory Visit
Admission: RE | Admit: 2017-07-22 | Discharge: 2017-07-22 | Disposition: A | Discharge status: Home / Self Care | Payer: 59 | Source: Ambulatory Visit | Attending: Obstetrics and Gynecology | Admitting: Obstetrics and Gynecology

## 2017-07-22 DIAGNOSIS — Z1231 Encounter for screening mammogram for malignant neoplasm of breast: Secondary | ICD-10-CM

## 2018-06-28 ENCOUNTER — Encounter (HOSPITAL_COMMUNITY): Payer: Self-pay | Admitting: Emergency Medicine

## 2018-06-28 ENCOUNTER — Emergency Department (HOSPITAL_COMMUNITY): Payer: Worker's Compensation

## 2018-06-28 ENCOUNTER — Other Ambulatory Visit: Payer: Self-pay

## 2018-06-28 ENCOUNTER — Emergency Department (HOSPITAL_COMMUNITY)
Admission: EM | Admit: 2018-06-28 | Discharge: 2018-06-28 | Disposition: A | Discharge status: Home / Self Care | Payer: Worker's Compensation | Attending: Emergency Medicine | Admitting: Emergency Medicine

## 2018-06-28 DIAGNOSIS — Y929 Unspecified place or not applicable: Secondary | ICD-10-CM | POA: Insufficient documentation

## 2018-06-28 DIAGNOSIS — Z79899 Other long term (current) drug therapy: Secondary | ICD-10-CM | POA: Diagnosis not present

## 2018-06-28 DIAGNOSIS — S0990XA Unspecified injury of head, initial encounter: Secondary | ICD-10-CM | POA: Insufficient documentation

## 2018-06-28 DIAGNOSIS — W01198A Fall on same level from slipping, tripping and stumbling with subsequent striking against other object, initial encounter: Secondary | ICD-10-CM | POA: Diagnosis not present

## 2018-06-28 DIAGNOSIS — Y939 Activity, unspecified: Secondary | ICD-10-CM | POA: Insufficient documentation

## 2018-06-28 DIAGNOSIS — W19XXXA Unspecified fall, initial encounter: Secondary | ICD-10-CM

## 2018-06-28 DIAGNOSIS — Y990 Civilian activity done for income or pay: Secondary | ICD-10-CM | POA: Insufficient documentation

## 2018-06-28 NOTE — Discharge Instructions (Signed)

## 2018-06-28 NOTE — ED Notes (Signed)
Pt requesting an additional assessment based on "cold like symptoms."

## 2018-06-28 NOTE — ED Provider Notes (Signed)
Patient placed in Quick Look pathway, seen and evaluated   Chief Complaint: fall  HPI: Angela Koch is a 57 y.o. female who presents to the ED s/p fall. Patient reports she slipped and fell at home and the right side of her head hit the window ledge. She denies LOC or neck pain. She does have pain on the right side of her head where she hit.   ROS: Neuro: headache  Physical Exam:  BP 109/75 (BP Location: Right Arm)   Pulse 60   Temp 98.9 F (37.2 C) (Oral)   Resp 16   SpO2 100%    Gen: No distress  Neuro: Awake and Alert  Skin: Warm and dry  HEENT: right side of head behind ear tender with palpation  Initiation of care has begun. The patient has been counseled on the process, plan, and necessity for staying for the completion/evaluation, and the remainder of the medical screening examination    Ashley Murrain, NP 06/28/18 1623    Julianne Rice, MD 06/30/18 [phone]

## 2018-06-28 NOTE — ED Triage Notes (Signed)
Pt st's she was at work this am and slipped falling backwards and hitting her head on the floor.  Pt denies LOC.  C/o pain in back of head

## 2018-06-28 NOTE — ED Provider Notes (Signed)
[facility] EMERGENCY DEPARTMENT Provider Note   CSN: [id number] Arrival date & time: 06/28/18  1601     History   Chief Complaint Chief Complaint  Patient presents with  . Fall  . Head Injury    HPI Angela Koch is a 57 y.o. female with past medical history of anemia, who presents today for evaluation after fall.  She reports this morning she was at work when she slipped and fell striking the right side of her head on a windowsill and then the floor.  She says the windowsill is down low.  She denies feeling lightheaded or dizzy before hand.  She did not pass out.  She does not take any blood thinning medications.  She denies any weakness, numbness, or tingling.  At the time of discharge patient voiced concern for a cold with nasal pressure.  She did not strike her face when she fell.  HPI  Past Medical History:  Diagnosis Date  . Anemia    H/O  . BV (bacterial vaginosis)    H/O  . H/O vitamin D deficiency   . Personal history of colonic polyp - adenoma 01/31/2014  . Vaginal yeast infection    RECURRENT    Patient Active Problem List   Diagnosis Date Noted  . Personal history of colonic polyp - adenoma 01/31/2014    Past Surgical History:  Procedure Laterality Date  . WISDOM TOOTH EXTRACTION       OB History    Gravida  3   Para  0   Term      Preterm      AB      Living  0     SAB      TAB      Ectopic      Multiple      Live Births               Home Medications    Prior to Admission medications   Medication Sig Start Date End Date Taking? Authorizing Provider  Cholecalciferol (VITAMIN D3) 50000 UNITS CAPS Take 1 capsule by mouth once a week. 11/01/12   Everett Graff, MD  diphenhydrAMINE (BENADRYL) 25 mg capsule Take 25 mg by mouth 2 (two) times daily.    [provider]  ibuprofen (ADVIL,MOTRIN) 200 MG tablet Take 200 mg by mouth every 6 (six) hours as needed.    [provider]  NONFORMULARY OR  COMPOUNDED ITEM Boric acid suppository 600 mg per vagina 2 x weekly for 4 weeks then as needed 05/25/12   Delsa Bern, MD    Family History Family History  Problem Relation Age of Onset  . Diabetes Father   . Diabetes Sister   . Lung cancer Paternal Aunt   . Breast cancer Maternal Grandmother   . Heart disease Maternal Grandmother   . Breast cancer Paternal Grandmother   . Diabetes Paternal Grandmother   . Breast cancer Maternal Aunt   . Breast cancer Maternal Aunt   . Breast cancer Maternal Aunt   . Breast cancer Maternal Aunt   . Breast cancer Cousin     Social History Social History   Tobacco Use  . Smoking status: Never Smoker  . Smokeless tobacco: Never Used  Substance Use Topics  . Alcohol use: No  . Drug use: No     Allergies   Soy allergy   Review of Systems Review of Systems  Constitutional: Negative for chills and fever.  Musculoskeletal: Positive  for neck pain.  Neurological: Positive for headaches. Negative for dizziness, syncope, weakness and numbness.  All other systems reviewed and are negative.    Physical Exam Updated Vital Signs BP 118/72 (BP Location: Right Arm)   Pulse 69   Temp 98.9 F (37.2 C) (Oral)   Resp 16   Ht 5\' 4"  (1.626 m)   Wt 77.1 kg   SpO2 99%   BMI 29.18 kg/m   Physical Exam  Constitutional: She appears well-developed and well-nourished. No distress.  HENT:  Head: Normocephalic.  Mouth/Throat: Oropharynx is clear and moist.  There is ecchymosis and edema behind the right ear.  There are no obvious raccoon eyes.  No hemotympanum bilaterally.  Eyes: Conjunctivae are normal. Right eye exhibits no discharge. Left eye exhibits no discharge. No scleral icterus.  Neck: Normal range of motion.  Cardiovascular: Normal rate and regular rhythm.  Pulmonary/Chest: Effort normal. No stridor. No respiratory distress.  Abdominal: She exhibits no distension.  Musculoskeletal: She exhibits no edema or deformity.  Neurological:  She is alert. She exhibits normal muscle tone.  Mental Status:  Alert, oriented, thought content appropriate, able to give a coherent history. Speech fluent without evidence of aphasia. Able to follow 2 step commands without difficulty.  Cranial Nerves:  II:  Peripheral visual fields grossly normal, pupils equal, round, reactive to light III,IV, VI: ptosis not present, extra-ocular motions intact bilaterally  V,VII: smile symmetric, facial light touch sensation equal VIII: hearing grossly normal to voice  X: uvula elevates symmetrically  XI: bilateral shoulder shrug symmetric and strong XII: midline tongue extension without fassiculations Motor:  Normal tone. 5/5 in upper and lower extremities bilaterally including strong and equal grip strength and dorsiflexion/plantar flexion Gait: normal gait and balance   Skin: Skin is warm and dry. She is not diaphoretic.  Psychiatric: She has a normal mood and affect. Her behavior is normal.  Nursing note and vitals reviewed.        ED Treatments / Results  Labs (all labs ordered are listed, but only abnormal results are displayed) Labs Reviewed - No data to display  EKG None  Radiology Ct Head Wo Contrast  Result Date: 06/28/2018 CLINICAL DATA:  57 year old who slipped and fell at home, striking the RIGHT side of her head on a window ledge. Patient denies loss of consciousness. She complains of headache. Initial encounter. EXAM: CT HEAD WITHOUT CONTRAST CT CERVICAL SPINE WITHOUT CONTRAST TECHNIQUE: Multidetector CT imaging of the head and cervical spine was performed following the standard protocol without intravenous contrast. Multiplanar CT image reconstructions of the cervical spine were also generated. COMPARISON:  None. FINDINGS: CT HEAD FINDINGS Brain: Ventricular system normal in size and appearance for age. No mass lesion. No midline shift. No acute hemorrhage or hematoma. No extra-axial fluid collections. No evidence of acute  infarction. No focal brain parenchymal abnormalities. Vascular: Minimal to mild BILATERAL carotid siphon atherosclerosis. No hyperdense vessel. Skull: No skull fracture or other focal osseous abnormality involving the skull. Sinuses/Orbits: Visualized paranasal sinuses, bilateral mastoid air cells and bilateral middle ear cavities well-aerated. Visualized orbits and globes normal in appearance. Other: None. CT CERVICAL SPINE FINDINGS Alignment: Anatomic POSTERIOR alignment. Reversal of the usual cervical lordosis. Skull base and vertebrae: No fractures identified involving the cervical spine. Facet joints anatomically aligned throughout with scattered degenerative changes. Coronal reformatted images demonstrate an intact craniocervical junction, intact dens and intact lateral masses throughout. Soft tissues and spinal canal: No evidence of paraspinous or spinal canal hematoma. No evidence of  spinal stenosis. Disc levels: Moderate disc space narrowing at C2-3, mild disc space narrowing at C5-6 and moderate disc space narrowing at C6-7. LEFT to facet and uncinate hypertrophy at C3-4 account for mild LEFT foraminal stenosis. LEFT facet degenerative changes at C4-5 account for mild LEFT foraminal stenosis. LEFT facet and uncinate hypertrophy account for mild to moderate LEFT foraminal stenosis at C6-7. Upper chest: Visualized lung apices clear. Visualized superior mediastinum normal. Other: None. IMPRESSION: CT Head: 1. No acute intracranial abnormality. CT Cervical Spine: 1. No cervical spine fractures identified. 2. 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 Electronically Signed   By: Evangeline Dakin M.D.   On: 06/28/2018 19:27   Ct Cervical Spine Wo Contrast  Result Date: 06/28/2018 CLINICAL DATA:  57 year old who slipped and fell at home, striking the RIGHT side of her head on a window ledge. Patient denies loss of consciousness. She complains of headache. Initial encounter.  EXAM: CT HEAD WITHOUT CONTRAST CT CERVICAL SPINE WITHOUT CONTRAST TECHNIQUE: Multidetector CT imaging of the head and cervical spine was performed following the standard protocol without intravenous contrast. Multiplanar CT image reconstructions of the cervical spine were also generated. COMPARISON:  None. FINDINGS: CT HEAD FINDINGS Brain: Ventricular system normal in size and appearance for age. No mass lesion. No midline shift. No acute hemorrhage or hematoma. No extra-axial fluid collections. No evidence of acute infarction. No focal brain parenchymal abnormalities. Vascular: Minimal to mild BILATERAL carotid siphon atherosclerosis. No hyperdense vessel. Skull: No skull fracture or other focal osseous abnormality involving the skull. Sinuses/Orbits: Visualized paranasal sinuses, bilateral mastoid air cells and bilateral middle ear cavities well-aerated. Visualized orbits and globes normal in appearance. Other: None. CT CERVICAL SPINE FINDINGS Alignment: Anatomic POSTERIOR alignment. Reversal of the usual cervical lordosis. Skull base and vertebrae: No fractures identified involving the cervical spine. Facet joints anatomically aligned throughout with scattered degenerative changes. Coronal reformatted images demonstrate an intact craniocervical junction, intact dens and intact lateral masses throughout. Soft tissues and spinal canal: No evidence of paraspinous or spinal canal hematoma. No evidence of spinal stenosis. Disc levels: Moderate disc space narrowing at C2-3, mild disc space narrowing at C5-6 and moderate disc space narrowing at C6-7. LEFT to facet and uncinate hypertrophy at C3-4 account for mild LEFT foraminal stenosis. LEFT facet degenerative changes at C4-5 account for mild LEFT foraminal stenosis. LEFT facet and uncinate hypertrophy account for mild to moderate LEFT foraminal stenosis at C6-7. Upper chest: Visualized lung apices clear. Visualized superior mediastinum normal. Other: None. IMPRESSION:  CT Head: 1. No acute intracranial abnormality. CT Cervical Spine: 1. No cervical spine fractures identified. 2. 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 Electronically Signed   By: Evangeline Dakin M.D.   On: 06/28/2018 19:27    Procedures Procedures (including critical care time)  Medications Ordered in ED Medications - No data to display   Initial Impression / Assessment and Plan / ED Course  I have reviewed the triage vital signs and the nursing notes.  Pertinent labs & imaging results that were available during my care of the patient were reviewed by me and considered in my medical decision making (see chart for details).    Patient presents today for evaluation after a fall at work where she struck the right side of her head on a windowsill.  She did not pass out or lose consciousness.  On exam patient has swelling and ecchymosis behind her right ear.  While this is most likely from  the fall and direct blow unable to apply [place] CT head criteria as this may represent a basilar skull fracture.  Discussed this with patient who elected for CT scan.  CT scan head and neck were obtained without evidence of acute abnormalities.  Patient was informed that she most likely has a concussion.  She is given a work note and concussion clinic follow-up as needed.  She did not have any deterioration while in the emergency room.  This patient was discussed with Dr. Francia Greaves who agreed with general plan.  Patient was informed of all CT results including incidental and chronic findings.    Return precautions were discussed with patient who states their understanding.  At the time of discharge patient denied any unaddressed complaints or concerns.  Patient is agreeable for discharge home.   Final Clinical Impressions(s) / ED Diagnoses   Final diagnoses:  Injury of head, initial encounter  Fall, initial encounter    ED Discharge Orders    None       Ollen Gross 06/28/18 2014    Valarie Merino, MD 07/02/18 [phone]

## 2018-10-27 ENCOUNTER — Other Ambulatory Visit: Payer: Self-pay | Admitting: Obstetrics and Gynecology

## 2019-01-12 ENCOUNTER — Encounter: Payer: Self-pay | Admitting: Internal Medicine

## 2019-04-27 IMAGING — CT CT CERVICAL SPINE W/O CM
4 of 7 series · 13 of 33 positions shown, 14 images · non-contrast
Comparison: None.

CLINICAL DATA: 57-year-old who slipped and fell at home, striking
the RIGHT side of her head on a window ledge. Patient denies loss of
consciousness. She complains of headache. Initial encounter.

EXAM:
CT HEAD WITHOUT CONTRAST
CT CERVICAL SPINE WITHOUT CONTRAST
TECHNIQUE: Multidetector CT imaging of the head and cervical spine was
performed following the standard protocol without intravenous
contrast. Multiplanar CT image reconstructions of the cervical spine
were also generated.

[Series 8: c_spine 2.0 st · axial · 0.33mm/px · z∈[-192,-102]mm · 4 of 77 slices shown, 5 images]
[im 16/77  soft-tissue]
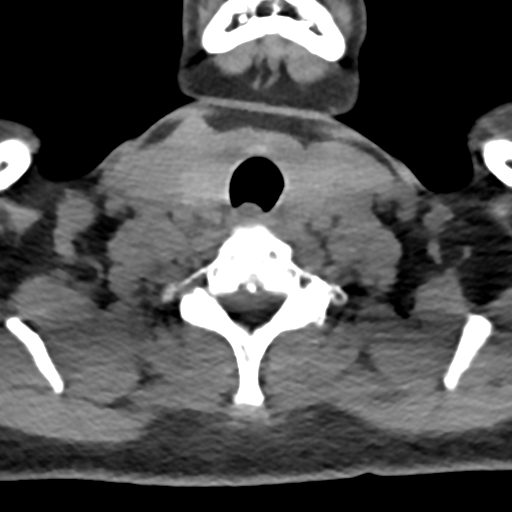
[im 16/77  bone]
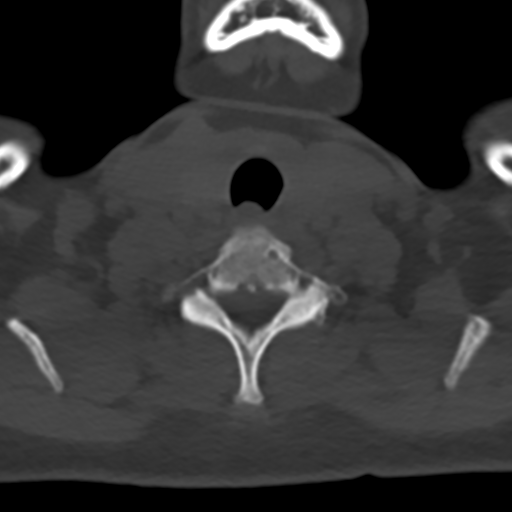
[im 31/77  bone]
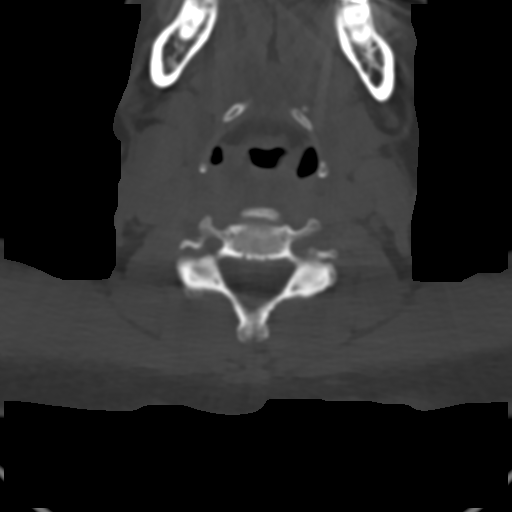
[im 46/77  bone]
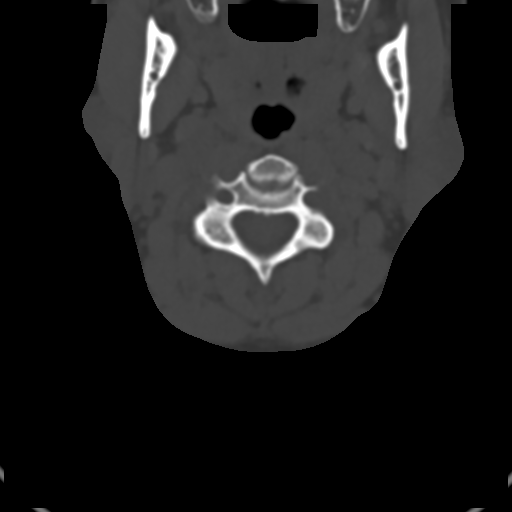
[im 61/77  bone]
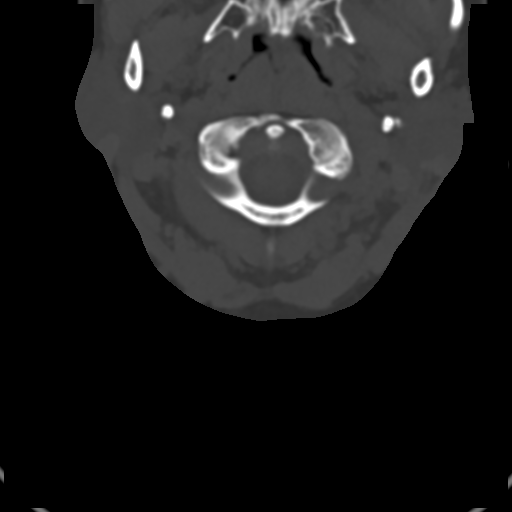

[Series 9: coronal bone · coronal · 0.24mm/px · 1 of 61 slices shown]
[im 31/61  bone]
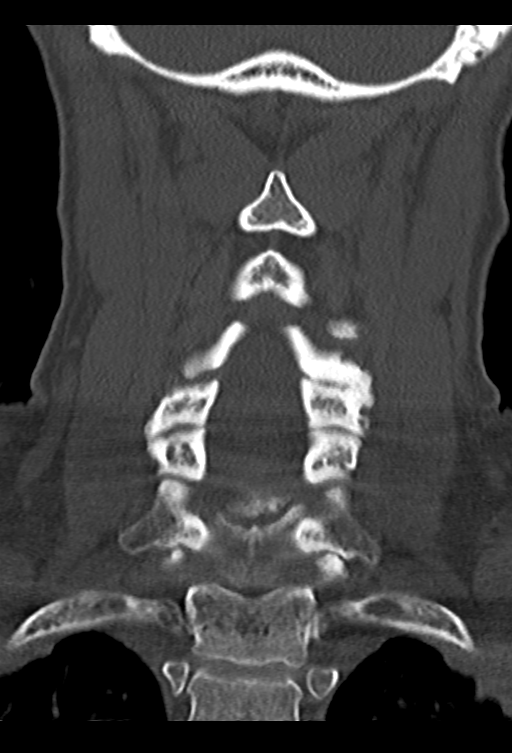

[Series 10: sagittal bone · sagittal · 0.27mm/px · 4 of 61 slices shown]
[im 13/61  bone]
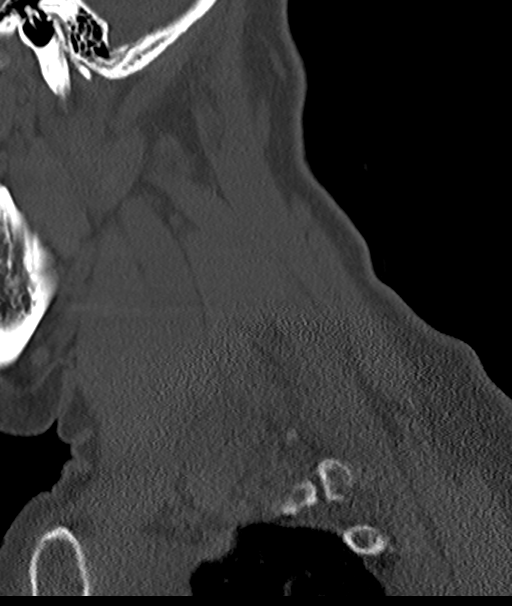
[im 25/61  bone]
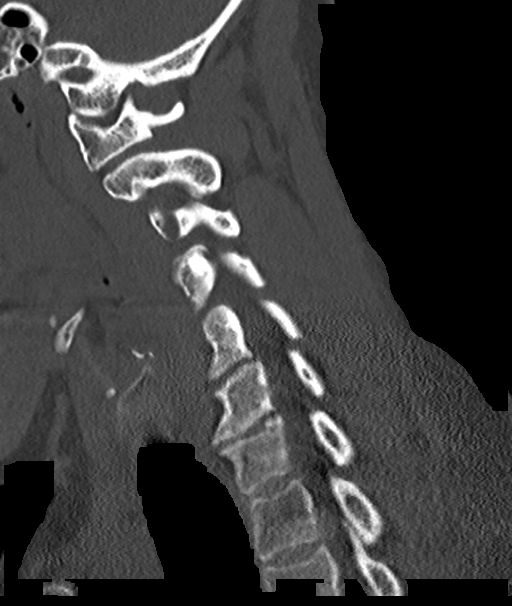
[im 37/61  bone]
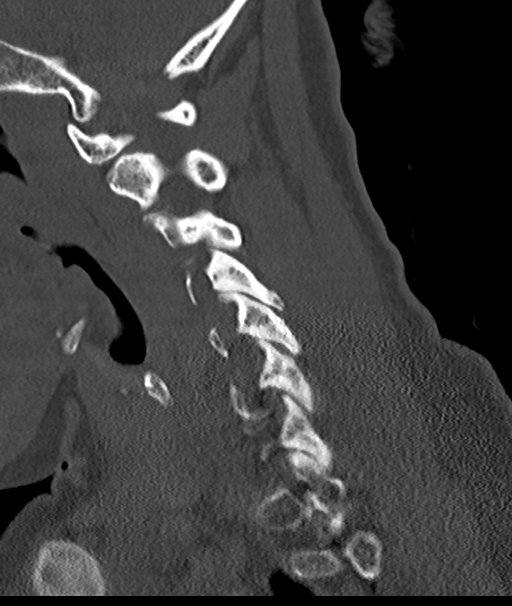
[im 49/61  bone]
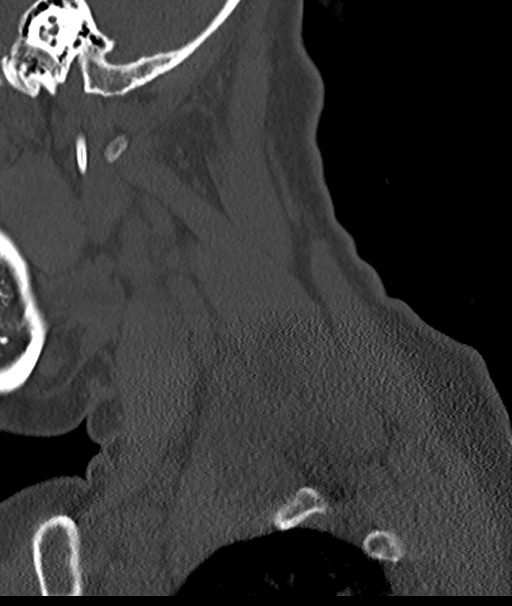

[Series 13: orthogonal axial st · axial · 0.21mm/px · z∈[-201,-111]mm · 4 of 77 slices shown]
[im 16/77  bone]
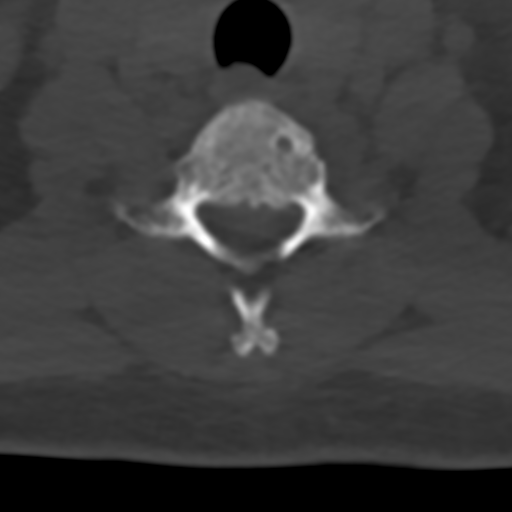
[im 31/77  bone]
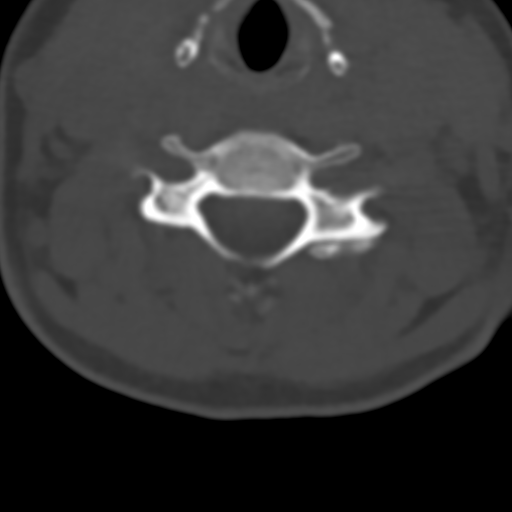
[im 46/77  bone]
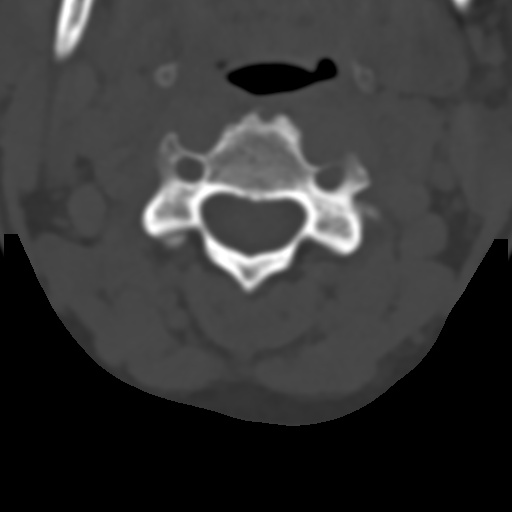
[im 61/77  bone]
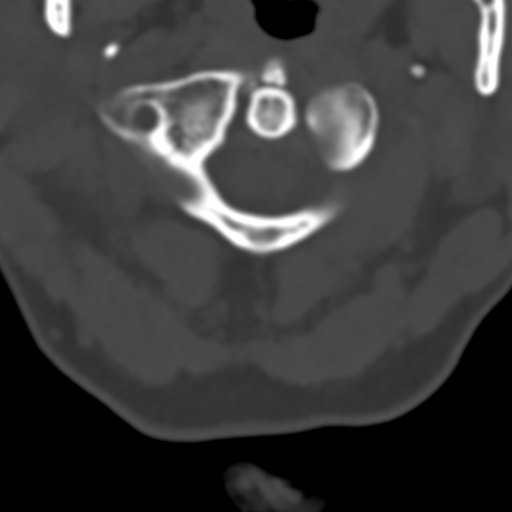

[13 of 33 positions shown; findings below may reference images not displayed]

FINDINGS: CT HEAD FINDINGS

Brain: Ventricular system normal in size and appearance for age. No
mass lesion. No midline shift. No acute hemorrhage or hematoma. No
extra-axial fluid collections. No evidence of acute infarction. No
focal brain parenchymal abnormalities.

Vascular: Minimal to mild BILATERAL carotid siphon atherosclerosis.
No hyperdense vessel.

Skull: No skull fracture or other focal osseous abnormality
involving the skull.

Sinuses/Orbits: Visualized paranasal sinuses, bilateral mastoid air
cells and bilateral middle ear cavities well-aerated. Visualized
orbits and globes normal in appearance.

Other: None.

CT CERVICAL SPINE FINDINGS

Alignment: Anatomic POSTERIOR alignment. Reversal of the usual
cervical lordosis.

Skull base and vertebrae: No fractures identified involving the
cervical spine. Facet joints anatomically aligned throughout with
scattered degenerative changes. Coronal reformatted images
demonstrate an intact craniocervical junction, intact dens and
intact lateral masses throughout.

Soft tissues and spinal canal: No evidence of paraspinous or spinal
canal hematoma. No evidence of spinal stenosis.

Disc levels: Moderate disc space narrowing at C2-3, mild disc space
narrowing at C5-6 and moderate disc space narrowing at C6-7. LEFT to
facet and uncinate hypertrophy at C3-4 account for mild LEFT
foraminal stenosis. LEFT facet degenerative changes at C4-5 account
for mild LEFT foraminal stenosis. LEFT facet and uncinate
hypertrophy account for mild to moderate LEFT foraminal stenosis at
C6-7.

Upper chest: Visualized lung apices clear. Visualized superior
mediastinum normal.

Other: None.
IMPRESSION: CT Head:

1. No acute intracranial abnormality.

CT Cervical Spine:

1. No cervical spine fractures identified.
2. Degenerative changes and multilevel LEFT foraminal stenoses as
detailed above.

## 2020-12-31 ENCOUNTER — Encounter: Payer: Self-pay | Admitting: Internal Medicine

## 2021-03-04 ENCOUNTER — Ambulatory Visit (AMBULATORY_SURGERY_CENTER): Payer: 59 | Admitting: *Deleted

## 2021-03-04 ENCOUNTER — Other Ambulatory Visit: Payer: Self-pay

## 2021-03-04 VITALS — Ht 64.0 in | Wt 182.0 lb

## 2021-03-04 DIAGNOSIS — Z86010 Personal history of colonic polyps: Secondary | ICD-10-CM

## 2021-03-04 NOTE — Progress Notes (Signed)
Soy makes her nose itch- had MAC 2015 no issues  No egg allergy known to patient  No issues with past sedation with any surgeries or procedures Patient denies ever being told they had issues or difficulty with intubation  No FH of Malignant Hyperthermia No diet pills per patient No home 02 use per patient  No blood thinners per patient  Pt denies issues with constipation  No A fib or A flutter  EMMI video to pt or via [place] 19 guidelines implemented in PV today with Pt and RN  Pt is fully vaccinated  for Covid   Due to the COVID-19 pandemic we are asking patients to follow certain guidelines.  Pt aware of COVID protocols and [facility] guidelines

## 2021-03-14 ENCOUNTER — Encounter: Payer: Self-pay | Admitting: Certified Registered Nurse Anesthetist

## 2021-03-15 ENCOUNTER — Encounter: Payer: Self-pay | Admitting: Internal Medicine

## 2021-03-15 ENCOUNTER — Ambulatory Visit (AMBULATORY_SURGERY_CENTER): Payer: 59 | Admitting: Internal Medicine

## 2021-03-15 ENCOUNTER — Other Ambulatory Visit: Payer: Self-pay

## 2021-03-15 VITALS — BP 108/76 | HR 69 | Temp 97.1°F | Resp 17 | Ht 64.0 in | Wt 182.0 lb

## 2021-03-15 DIAGNOSIS — Z86010 Personal history of colonic polyps: Secondary | ICD-10-CM | POA: Diagnosis not present

## 2021-03-15 MED ORDER — SODIUM CHLORIDE 0.9 % IV SOLN: 500.0000 mL | INTRAVENOUS | Status: DC

## 2021-03-15 NOTE — Progress Notes (Signed)
Report given to PACU, vss 

## 2021-03-15 NOTE — Patient Instructions (Addendum)
No polyps today.  You still have diverticulosis - thickened muscle rings and pouches in the colon wall. Please read the handout about this condition.  Next routine colonoscopy or other screening test in 10 years - 2032.  I appreciate the opportunity to care for you. Gatha Mayer, MD, FACG  YOU HAD AN ENDOSCOPIC PROCEDURE TODAY AT [facility]:   Refer to the procedure report that was given to you for any specific questions about what was found during the examination.  If the procedure report does not answer your questions, please call your gastroenterologist to clarify.  If you requested that your care partner not be given the details of your procedure findings, then the procedure report has been included in a sealed envelope for you to review at your convenience later.  YOU SHOULD EXPECT: Some feelings of bloating in the abdomen. Passage of more gas than usual.  Walking can help get rid of the air that was put into your GI tract during the procedure and reduce the bloating. If you had a lower endoscopy (such as a colonoscopy or flexible sigmoidoscopy) you may notice spotting of blood in your stool or on the toilet paper. If you underwent a bowel prep for your procedure, you may not have a normal bowel movement for a few days.  Please Note:  You might notice some irritation and congestion in your nose or some drainage.  This is from the oxygen used during your procedure.  There is no need for concern and it should clear up in a day or so.  SYMPTOMS TO REPORT IMMEDIATELY:  Following lower endoscopy (colonoscopy or flexible sigmoidoscopy):  Excessive amounts of blood in the stool  Significant tenderness or worsening of abdominal pains  Swelling of the abdomen that is new, acute  Fever of 100F or higher   For urgent or emergent issues, a gastroenterologist can be reached at any hour by calling [phone]. Do not use MyChart messaging for urgent concerns.    DIET:  We  do recommend a small meal at first, but then you may proceed to your regular diet.  Drink plenty of fluids but you should avoid alcoholic beverages for 24 hours.  ACTIVITY:  You should plan to take it easy for the rest of today and you should NOT DRIVE or use heavy machinery until tomorrow (because of the sedation medicines used during the test).    FOLLOW UP: Our staff will call the number listed on your records 48-72 hours following your procedure to check on you and address any questions or concerns that you may have regarding the information given to you following your procedure. If we do not reach you, we will leave a message.  We will attempt to reach you two times.  During this call, we will ask if you have developed any symptoms of COVID 19. If you develop any symptoms (ie: fever, flu-like symptoms, shortness of breath, cough etc.) before then, please call [phone].  If you test positive for Covid 19 in the 2 weeks post procedure, please call and report this information to [place].     SIGNATURES/CONFIDENTIALITY: You and/or your care partner have signed paperwork which will be entered into your electronic medical record.  These signatures attest to the fact that that the information above on your After Visit Summary has been reviewed and is understood.  Full responsibility of the confidentiality of this discharge information lies with you and/or your care-partner.

## 2021-03-15 NOTE — Progress Notes (Signed)
Vs CW I have reviewed the patient's medical history in detail and updated the computerized patient record.   

## 2021-03-15 NOTE — Op Note (Signed)
[facility] Patient Name: Angela Koch Procedure Date: 03/15/2021 9:31 AM MRN: [id number] Endoscopist: Gatha Mayer , MD Age: 60 Referring MD:  Date of Birth: 09/24/1960 Gender: Female Account #: [id number] Procedure:                Colonoscopy Indications:              Surveillance: Personal history of adenomatous                            polyps on last colonoscopy > 5 years ago, Last                            colonoscopy: 2015 Medicines:                Propofol per Anesthesia, Monitored Anesthesia Care Procedure:                Pre-Anesthesia Assessment:                           - Prior to the procedure, a History and Physical                            was performed, and patient medications and                            allergies were reviewed. The patient's tolerance of                            previous anesthesia was also reviewed. The risks                            and benefits of the procedure and the sedation                            options and risks were discussed with the patient.                            All questions were answered, and informed consent                            was obtained. Prior Anticoagulants: The patient has                            taken no previous anticoagulant or antiplatelet                            agents. ASA Grade Assessment: II - A patient with                            mild systemic disease. After reviewing the risks                            and benefits, the patient was deemed in  satisfactory condition to undergo the procedure.                           After obtaining informed consent, the colonoscope                            was passed under direct vision. Throughout the                            procedure, the patient's blood pressure, pulse, and                            oxygen saturations were monitored continuously. The                            CF [id number] was  introduced through the anus                            and advanced to the the cecum, identified by                            appendiceal orifice and ileocecal valve. The                            colonoscopy was performed without difficulty. The                            patient tolerated the procedure well. The quality                            of the bowel preparation was good. The ileocecal                            valve, appendiceal orifice, and rectum were                            photographed. The bowel preparation used was                            Miralax via split dose instruction. Scope In: 9:34:33 AM Scope Out: [id number] AM Scope Withdrawal Time: 0 hours 8 minutes 0 seconds  Total Procedure Duration: 0 hours 12 minutes 25 seconds  Findings:                 The perianal and digital rectal examinations were                            normal.                           Multiple diverticula were found in the sigmoid                            colon, descending colon, transverse colon and  ascending colon.                           The exam was otherwise without abnormality on                            direct and retroflexion views. Complications:            No immediate complications. Estimated Blood Loss:     Estimated blood loss: none. Impression:               - Diverticulosis in the sigmoid colon, in the                            descending colon, in the transverse colon and in                            the ascending colon.                           - The examination was otherwise normal on direct                            and retroflexion views.                           - No specimens collected. Recommendation:           - Patient has a contact number available for                            emergencies. The signs and symptoms of potential                            delayed complications were discussed with the                             patient. Return to normal activities tomorrow.                            Written discharge instructions were provided to the                            patient.                           - Resume previous diet.                           - Continue present medications.                           - Repeat colonoscopy in 1 year. Gatha Mayer, MD 03/15/2021 9:51:48 AM This report has been signed electronically.

## 2021-03-19 ENCOUNTER — Telehealth: Payer: Self-pay

## 2021-03-19 NOTE — Telephone Encounter (Signed)
  Follow up Call-  Call back number 03/15/2021  Post procedure Call Back phone  # [phone]  Permission to leave phone message No  Some recent data might be hidden     Patient questions:  Do you have a fever, pain , or abdominal swelling? No. Pain Score  0 *  Have you tolerated food without any problems? Yes.    Have you been able to return to your normal activities? Yes.    Do you have any questions about your discharge instructions: Diet   No. Medications  No. Follow up visit  No.  Do you have questions or concerns about your Care? No.  Actions: * If pain score is 4 or above: No action needed, pain <4. Have you developed a fever since your procedure? no  2.   Have you had an respiratory symptoms (SOB or cough) since your procedure? no  3.   Have you tested positive for COVID 19 since your procedure no  4.   Have you had any family members/close contacts diagnosed with the COVID 19 since your procedure?  no   If yes to any of these questions please route to Joylene John, RN and Joella Prince, RN

## 2021-12-03 DIAGNOSIS — Z0289 Encounter for other administrative examinations: Secondary | ICD-10-CM

## 2021-12-10 ENCOUNTER — Encounter (INDEPENDENT_AMBULATORY_CARE_PROVIDER_SITE_OTHER): Payer: Self-pay | Admitting: Family Medicine

## 2021-12-10 ENCOUNTER — Ambulatory Visit (INDEPENDENT_AMBULATORY_CARE_PROVIDER_SITE_OTHER): Payer: 59 | Admitting: Family Medicine

## 2021-12-10 VITALS — BP 118/79 | HR 61 | Temp 97.8°F | Ht 64.0 in | Wt 181.0 lb

## 2021-12-10 DIAGNOSIS — M545 Low back pain, unspecified: Secondary | ICD-10-CM

## 2021-12-10 DIAGNOSIS — E7849 Other hyperlipidemia: Secondary | ICD-10-CM | POA: Diagnosis not present

## 2021-12-10 DIAGNOSIS — E559 Vitamin D deficiency, unspecified: Secondary | ICD-10-CM

## 2021-12-10 DIAGNOSIS — Z1331 Encounter for screening for depression: Secondary | ICD-10-CM

## 2021-12-10 DIAGNOSIS — Z6831 Body mass index (BMI) 31.0-31.9, adult: Secondary | ICD-10-CM

## 2021-12-10 DIAGNOSIS — R0602 Shortness of breath: Secondary | ICD-10-CM | POA: Diagnosis not present

## 2021-12-10 DIAGNOSIS — E7841 Elevated Lipoprotein(a): Secondary | ICD-10-CM

## 2021-12-10 DIAGNOSIS — Z9189 Other specified personal risk factors, not elsewhere classified: Secondary | ICD-10-CM

## 2021-12-10 DIAGNOSIS — G8929 Other chronic pain: Secondary | ICD-10-CM | POA: Diagnosis not present

## 2021-12-10 DIAGNOSIS — R5383 Other fatigue: Secondary | ICD-10-CM | POA: Diagnosis not present

## 2021-12-10 DIAGNOSIS — E669 Obesity, unspecified: Secondary | ICD-10-CM

## 2021-12-11 LAB — COMPREHENSIVE METABOLIC PANEL
ALT: 19 IU/L (ref 0–32)
AST: 25 IU/L (ref 0–40)
Albumin/Globulin Ratio: 1.6 (ref 1.2–2.2)
Albumin: 4.5 g/dL (ref 3.8–4.9)
Alkaline Phosphatase: 62 IU/L (ref 44–121)
BUN/Creatinine Ratio: 11 (ref 9–23)
BUN: 10 mg/dL (ref 6–24)
Bilirubin Total: 0.2 mg/dL (ref 0.0–1.2)
CO2: 22 mmol/L (ref 20–29)
Calcium: 9.4 mg/dL (ref 8.7–10.2)
Chloride: 108 mmol/L — ABNORMAL HIGH (ref 96–106)
Creatinine, Ser: 0.94 mg/dL (ref 0.57–1.00)
Globulin, Total: 2.9 g/dL (ref 1.5–4.5)
Glucose: 85 mg/dL (ref 70–99)
Potassium: 4.6 mmol/L (ref 3.5–5.2)
Sodium: 148 mmol/L — ABNORMAL HIGH (ref 134–144)
Total Protein: 7.4 g/dL (ref 6.0–8.5)
eGFR: 70 mL/min/{1.73_m2} (ref >59)

## 2021-12-11 LAB — CBC WITH DIFFERENTIAL/PLATELET
Basophils Absolute: 0 10*3/uL (ref 0.0–0.2)
Basos: 1 %
EOS (ABSOLUTE): 0.1 10*3/uL (ref 0.0–0.4)
Eos: 2 %
Hematocrit: 38.1 % (ref 34.0–46.6)
Hemoglobin: 13.4 g/dL (ref 11.1–15.9)
Immature Grans (Abs): 0 10*3/uL (ref 0.0–0.1)
Immature Granulocytes: 0 %
Lymphocytes Absolute: 1.9 10*3/uL (ref 0.7–3.1)
Lymphs: 69 %
MCH: 30.5 pg (ref 26.6–33.0)
MCHC: 35.2 g/dL (ref 31.5–35.7)
MCV: 87 fL (ref 79–97)
Monocytes Absolute: 0.2 10*3/uL (ref 0.1–0.9)
Monocytes: 8 %
Neutrophils Absolute: 0.5 10*3/uL — ABNORMAL LOW (ref 1.4–7.0)
Neutrophils: 20 %
Platelets: 308 10*3/uL (ref 150–450)
RBC: 4.39 x10E6/uL (ref 3.77–5.28)
RDW: 13.1 % (ref 11.7–15.4)
WBC: 2.7 10*3/uL — ABNORMAL LOW (ref 3.4–10.8)

## 2021-12-11 LAB — VITAMIN B12: Vitamin B-12: 1378 pg/mL — ABNORMAL HIGH (ref 232–1245)

## 2021-12-11 LAB — LIPID PANEL WITH LDL/HDL RATIO
Cholesterol, Total: 284 mg/dL — ABNORMAL HIGH (ref 100–199)
HDL: 116 mg/dL (ref >39)
LDL Chol Calc (NIH): 159 mg/dL — ABNORMAL HIGH (ref 0–99)
LDL/HDL Ratio: 1.4 ratio (ref 0.0–3.2)
Triglycerides: 59 mg/dL (ref 0–149)
VLDL Cholesterol Cal: 9 mg/dL (ref 5–40)

## 2021-12-11 LAB — TSH: TSH: 1.68 u[IU]/mL (ref 0.450–4.500)

## 2021-12-11 LAB — T4: T4, Total: 6.8 ug/dL (ref 4.5–12.0)

## 2021-12-11 LAB — INSULIN, RANDOM: INSULIN: 5.8 u[IU]/mL (ref 2.6–24.9)

## 2021-12-11 LAB — HEMOGLOBIN A1C
Est. average glucose Bld gHb Est-mCnc: 111 mg/dL
Hgb A1c MFr Bld: 5.5 % (ref 4.8–5.6)

## 2021-12-11 LAB — VITAMIN D 25 HYDROXY (VIT D DEFICIENCY, FRACTURES): Vit D, 25-Hydroxy: 40.3 ng/mL (ref 30.0–100.0)

## 2021-12-11 LAB — FOLATE: Folate: >20 ng/mL (ref >3.0)

## 2021-12-24 ENCOUNTER — Encounter (INDEPENDENT_AMBULATORY_CARE_PROVIDER_SITE_OTHER): Payer: Self-pay | Admitting: Family Medicine

## 2021-12-24 ENCOUNTER — Ambulatory Visit (INDEPENDENT_AMBULATORY_CARE_PROVIDER_SITE_OTHER): Payer: 59 | Admitting: Family Medicine

## 2021-12-24 VITALS — BP 125/87 | HR 69 | Temp 97.7°F | Ht 64.0 in | Wt 176.0 lb

## 2021-12-24 DIAGNOSIS — D729 Disorder of white blood cells, unspecified: Secondary | ICD-10-CM | POA: Diagnosis not present

## 2021-12-24 DIAGNOSIS — E559 Vitamin D deficiency, unspecified: Secondary | ICD-10-CM

## 2021-12-24 DIAGNOSIS — Z6830 Body mass index (BMI) 30.0-30.9, adult: Secondary | ICD-10-CM

## 2021-12-24 DIAGNOSIS — E66811 Obesity, class 1: Secondary | ICD-10-CM

## 2021-12-24 DIAGNOSIS — E7849 Other hyperlipidemia: Secondary | ICD-10-CM | POA: Diagnosis not present

## 2021-12-24 DIAGNOSIS — E669 Obesity, unspecified: Secondary | ICD-10-CM

## 2021-12-31 NOTE — Progress Notes (Signed)
? ? ? ? ?Chief Complaint:  ? ?OBESITY ?Angela Koch (MR# [id number]) is a 61 y.o. female who presents for evaluation and treatment of obesity and related comorbidities. Current Body mass index is 31.07 kg/m?Marland Kitchen Angela Koch has been struggling with her weight for many years and has been unsuccessful in either losing weight, maintaining weight loss, or reaching her healthy weight goal. ? 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 She lives alone. She states she wants to lose quickly and just cant seem to lose weight now. Is a Pescatarian for many years. She likes desserts such as ice cream etc. Snacks on chips, crackers, and cheese. Often skips lunch. ? ?Angela Koch is currently in the action stage of change and ready to dedicate time achieving and maintaining a healthier weight. Angela Koch is interested in becoming our patient and working on intensive lifestyle modifications including (but not limited to) diet and exercise for weight loss. ? ?Angela Koch's habits were reviewed today and are as follows: her desired weight loss is 31-36 lbs, she has been heavy most of her life, she started gaining weight in 2019, her heaviest weight ever was 181 pounds, she has significant food cravings issues, she snacks frequently in the evenings, she skips meals frequently, she is trying to follow a vegetarian diet, she is frequently drinking liquids with calories, she frequently eats larger portions than normal, and she struggles with emotional eating. ? ?Depression Screen ?Angela Koch's Food and Mood (modified PHQ-9) score was 11. ? ? ?  12/10/2021  ?  8:34 AM  ?Depression screen PHQ 2/9  ?Decreased Interest 2  ?Down, Depressed, Hopeless 1  ?PHQ - 2 Score 3  ?Altered sleeping 2  ?Tired, decreased energy 2  ?Change in appetite 1  ?Feeling bad or failure about yourself  1  ?Trouble concentrating 2  ?Moving slowly or fidgety/restless 0  ?Suicidal thoughts 0  ?PHQ-9 Score 11  ?Difficult doing work/chores Not difficult at all  ? ?Subjective:  ? ?1.  Other fatigue ?Belita admits to daytime somnolence and admits to waking up still tired. Patient has a history of symptoms of daytime fatigue, morning fatigue, and morning headache. Angela Koch generally gets 6 or 7 hours of sleep per night, and states that she has generally restful sleep. Snoring is not present. Apneic episodes are not present. Epworth Sleepiness Score is 6. PHQ-- score is 11 but she denies a history of depression or concerns with emotional eating.  ? ?2. SOB (shortness of breath) on exertion ?Angela Koch notes increasing shortness of breath with exercising and seems to be worsening over time with weight gain. She notes getting out of breath sooner with activity than she used to. This has not gotten worse recently. Angela Koch denies shortness of breath at rest or orthopnea. ? ?3. Other hyperlipidemia ?LDL 159 approximately 1.5 years ago. No medications. Tried to avoid high cholesterol foods.  ? ?4. Chronic low back pain, unspecified back pain laterality, unspecified whether sciatica present ?Seeing specialists, and on gabapentin and they are considering epidural steroid injections. Does PT for back pain, newly seen and likely to go 1-2 days per week.  ? ?5. Vitamin D deficiency ?She is currently taking OTC vitamin D each day, and she notes fatigue. She denies nausea, vomiting or muscle weakness. ? ?6. At risk for impaired metabolic function ?Angela Koch is at increased risk for impaired metabolic function due to current nutrition and muscle mass. ? ?Assessment/Plan:  ? ?Orders Placed This Encounter  ?Procedures  ? Vitamin B12  ? CBC  with Differential/Platelet  ? Comprehensive metabolic panel  ? Hemoglobin A1c  ? Folate  ? Lipid Panel With LDL/HDL Ratio  ? Insulin, random  ? T4  ? TSH  ? VITAMIN D 25 Hydroxy (Vit-D Deficiency, Fractures)  ? EKG 12-Lead  ? ? ?There are no discontinued medications.  ? ?No orders of the defined types were placed in this encounter. ?  ? ?1. Other fatigue ?Angela Koch does feel that her weight is  causing her energy to be lower than it should be. Fatigue may be related to obesity, depression or many other causes. Labs will be ordered, and in the meanwhile, Angela Koch will focus on self care including making healthy food choices, increasing physical activity and focusing on stress reduction. ? ?- EKG 12-Lead ?- Vitamin B12 ?- CBC with Differential/Platelet ?- Hemoglobin A1c ?- Folate ?- Insulin, random ?- T4 ?- TSH ? ?2. SOB (shortness of breath) on exertion ?Angela Koch does feel that she gets out of breath more easily that she used to when she exercises. Angela Koch's shortness of breath appears to be obesity related and exercise induced. She has agreed to work on weight loss and gradually increase exercise to treat her exercise induced shortness of breath. Will continue to monitor closely. ? ?3. Other hyperlipidemia ?Cardiovascular risk and specific lipid/LDL goals reviewed.  We discussed several lifestyle modifications today. We will check labs today. Angela Koch will follow her prudent nutritional plan, decrease fatty cheeses, egg yolks, etc. Orders and follow up as documented in patient record.  ? ?Counseling ?Intensive lifestyle modifications are the first line treatment for this issue. ?Dietary changes: Increase soluble fiber. Decrease simple carbohydrates. ?Exercise changes: Moderate to vigorous-intensity aerobic activity 150 minutes per week if tolerated. ?Lipid-lowering medications: see documented in medical record. ? ?- Comprehensive metabolic panel ?- Lipid Panel With LDL/HDL Ratio ? ?4. Chronic low back pain, unspecified back pain laterality, unspecified whether sciatica present ?Care plan per PT and spinal specialists. Will get future direction of exercises and restrictions per PT.  ? ?5. Vitamin D deficiency ?We will check labs today. Let us know the dose of all OTC supplements and Vitamins at her next office visit. ? ?- VITAMIN D 25 Hydroxy (Vit-D Deficiency, Fractures) ? ?6. Depression screening ?Angela Koch had a  positive depression screening. Depression is commonly associated with obesity and often results in emotional eating behaviors. We will monitor this closely and work on CBT to help improve the non-hunger eating patterns. Referral to Psychology may be required if no improvement is seen as she continues in our clinic. ? ?7. At risk for impaired metabolic function ?Angela Koch was given approximately 15 minutes of impaired  metabolic function prevention counseling today. We discussed intensive lifestyle modifications today with an emphasis on specific nutrition and exercise instructions and strategies.  ? ?Repetitive spaced learning was employed today to elicit superior memory formation and behavioral change. ? ?8. Class 1 obesity with serious comorbidity and body mass index (BMI) of 31.0 to 31.9 in adult, unspecified obesity type ?Angela Koch is currently in the action stage of change and her goal is to continue with weight loss efforts. I recommend Angela Koch begin the structured treatment plan as follows: ? ?She has agreed to the [employer]. ? ?Exercise goals: As is for now.  ? ?Behavioral modification strategies: increasing lean protein intake, decreasing simple carbohydrates, decreasing liquid calories, and no skipping meals. ? ?She was informed of the importance of frequent follow-up visits to maximize her success with intensive lifestyle modifications for her multiple health conditions. She  was informed we would discuss her lab results at her next visit unless there is a critical issue that needs to be addressed sooner. Angela Koch agreed to keep her next visit at the agreed upon time to discuss these results. ? ?Objective:  ? ?Blood pressure 118/79, pulse 61, temperature 97.8 ?F (36.6 ?C), height '5\' 4"'$  (1.626 m), weight 181 lb (82.1 kg), last menstrual period 01/08/2015, SpO2 100 %. Body mass index is 31.07 kg/m?. ? ?EKG: Normal sinus rhythm, rate 59 BPM. ? ?Indirect Calorimeter completed today shows a VO2 of 168 and a REE of  1152.  Her calculated basal metabolic rate is 3419 thus her basal metabolic rate is worse than expected. ? ?General: Cooperative, alert, well developed, in no acute distress. ?HEENT: Conjunctivae and lids unremarkab

## 2022-01-04 DIAGNOSIS — D729 Disorder of white blood cells, unspecified: Secondary | ICD-10-CM | POA: Insufficient documentation

## 2022-01-04 DIAGNOSIS — E7849 Other hyperlipidemia: Secondary | ICD-10-CM | POA: Insufficient documentation

## 2022-01-04 NOTE — Progress Notes (Signed)
Chief Complaint:   OBESITY Angela Koch is here to discuss her progress with her obesity treatment plan along with follow-up of her obesity related diagnoses. Angela Koch is on the [employer] and states she is following her eating plan approximately 75% of the time. Coty states she is not exercising 0 minutes 0 times per week.  Today's visit was #: 2 Starting weight: 12/10/2021 Starting date: 181 lbs Today's weight: 176 lbs Today's date: 12/24/2021 Total lbs lost to date: 5 lbs Total lbs lost since last in-office visit: 5  Interim History: Angela Koch is hungry mostly at night.  She has switched meals around and eats breakfast in the evening.  Angela Koch is hungry in the morning, she is doing eggs for breakfast, salad at lunch and dinner is salad with tuna.  Angela Koch is doing fresh tuna or salmon.  Angela Koch is switching to grilled cheese at breakfast.  In the next two week she is in town but leaves on May 15th and comes back Saturday (4 day trip).  Subjective:   1. Other hyperlipidemia Amiyah's last LDL 159, HDL 116, Triglycerides 59.  10 year ASCVD risk.  2. Vitamin D deficiency Angela Koch's last Vitamin D level of 40.3.  Angela Koch is unsure of IU amount.  3. Abnormal white blood cell (WBC) count (low WBC cells) [name]'s WBC count was 2.7, Neutrophil # 0.5. [name] had a low WBC in 2019 (3.0). [name] voices that she may have been told that decreased WBC count attributed to ibuprofen/naproxen use.   Assessment/Plan:   1. Other hyperlipidemia Angela Koch has agreed to follow up with labs in 3-4 months for assessment of risk.   2. Vitamin D deficiency Angela Koch' is to notify Dr Raliegh Scarlet at next appointment of Vitamin D amount she is taking.  3. Abnormal white blood cell (WBC) count (low WBC cells) Angela Koch has agreed to repeat labs in 1-2 months.  4. Obesity with current BMI of 30.3 Angela Koch is currently in the action stage of change. As such, her goal is to continue with weight loss efforts. She has agreed to keeping a food  journal and adhering to recommended goals of 1050-1200 calories and 75+ daily protein and the [place].   Exercise goals: No exercise has been prescribed at this time.  Behavioral modification strategies: increasing lean protein intake, meal planning and cooking strategies, keeping healthy foods in the home, and planning for success.  Angela Koch has agreed to follow-up with our clinic in 3 weeks. She was informed of the importance of frequent follow-up visits to maximize her success with intensive lifestyle modifications for her multiple health conditions.   Objective:   Blood pressure 125/87, pulse 69, temperature 97.7 F (36.5 C), height '5\' 4"'$  (1.626 m), weight 176 lb (79.8 kg), last menstrual period 01/08/2015, SpO2 99 %. Body mass index is 30.21 kg/m.  General: Cooperative, alert, well developed, in no acute distress. HEENT: Conjunctivae and lids unremarkable. Cardiovascular: Regular rhythm.  Lungs: Normal work of breathing. Neurologic: No focal deficits.   Lab Results  Component Value Date   CREATININE 0.94 12/10/2021   BUN 10 12/10/2021   NA 148 (H) 12/10/2021   K 4.6 12/10/2021   CL 108 (H) 12/10/2021   CO2 22 12/10/2021   Lab Results  Component Value Date   ALT 19 12/10/2021   AST 25 12/10/2021   ALKPHOS 62 12/10/2021   BILITOT 0.2 12/10/2021   Lab Results  Component Value Date   HGBA1C 5.5 12/10/2021   Lab Results  Component Value  Date   INSULIN 5.8 12/10/2021   Lab Results  Component Value Date   TSH 1.680 12/10/2021   Lab Results  Component Value Date   CHOL 284 (H) 12/10/2021   HDL 116 12/10/2021   LDLCALC 159 (H) 12/10/2021   TRIG 59 12/10/2021   Lab Results  Component Value Date   VD25OH 40.3 12/10/2021   VD25OH 24 (L) 10/22/2012   VD25OH 15 (L) 07/28/2012   Lab Results  Component Value Date   WBC 2.7 (L) 12/10/2021   HGB 13.4 12/10/2021   HCT 38.1 12/10/2021   MCV 87 12/10/2021   PLT 308 12/10/2021   No results found for: IRON,  TIBC, FERRITIN  Attestation Statements:   Reviewed by clinician on day of visit: allergies, medications, problem list, medical history, surgical history, family history, social history, and previous encounter notes.  I, Davy Pique, RMA, am acting as transcriptionist for Coralie Common, MD. I have reviewed the above documentation for accuracy and completeness, and I agree with the above. - Coralie Common, MD

## 2022-01-14 ENCOUNTER — Ambulatory Visit (INDEPENDENT_AMBULATORY_CARE_PROVIDER_SITE_OTHER): Payer: 59 | Admitting: Nurse Practitioner

## 2022-01-15 ENCOUNTER — Encounter (INDEPENDENT_AMBULATORY_CARE_PROVIDER_SITE_OTHER): Payer: Self-pay | Admitting: Family Medicine

## 2022-01-15 ENCOUNTER — Ambulatory Visit (INDEPENDENT_AMBULATORY_CARE_PROVIDER_SITE_OTHER): Payer: 59 | Admitting: Family Medicine

## 2022-01-15 VITALS — BP 118/81 | HR 64 | Temp 97.6°F | Ht 64.0 in | Wt 180.0 lb

## 2022-01-15 DIAGNOSIS — Z9189 Other specified personal risk factors, not elsewhere classified: Secondary | ICD-10-CM

## 2022-01-15 DIAGNOSIS — E669 Obesity, unspecified: Secondary | ICD-10-CM

## 2022-01-15 DIAGNOSIS — E559 Vitamin D deficiency, unspecified: Secondary | ICD-10-CM | POA: Diagnosis not present

## 2022-01-15 DIAGNOSIS — Z6831 Body mass index (BMI) 31.0-31.9, adult: Secondary | ICD-10-CM

## 2022-01-24 NOTE — Progress Notes (Signed)
Chief Complaint:   OBESITY Angela Koch is here to discuss her progress with her obesity treatment plan along with follow-up of her obesity related diagnoses. Angela Koch is on keeping a food journal and adhering to recommended goals of 1050-1200 calories and 75+ grams protein and states she is following her eating plan approximately 40% of the time. Angela Koch states she is not currently exercising.  Today's visit was #: 3 Starting weight: 12/10/2021 Starting date: 181 lbs Today's weight: 180 lbs Today's date: 01/15/2022 Total lbs lost to date: 1 Total lbs lost since last in-office visit: +4  Interim History: Pt reports she had a work conference last week making it difficult to stay on plan. She ate out for every meal.  Subjective:   1. Vitamin D deficiency Angela Koch is taking OTC Vit D 2,000 IU daily with a Vit D level of 40.  2. At risk for dehydration Angela Koch is at risk for dehydration due to inadequate intake.  Assessment/Plan:  No orders of the defined types were placed in this encounter.   There are no discontinued medications.   No orders of the defined types were placed in this encounter.    1. Vitamin D deficiency Low Vitamin D level contributes to fatigue and are associated with obesity, breast, and colon cancer. She agrees to increase OTC Vitamin D to 4,000 IU daily and will follow-up for routine testing of Vitamin D, at least 2-3 times per year to avoid over-replacement. Repeat lab in 3 months.  2. At risk for dehydration Angela Koch is at higher than average risk of dehydration. Angela Koch was given more than 9 minutes of proper hydration counseling today. We discussed the signs and symptoms of dehydration, some of which may include muscle cramping, constipation or even orthostatic symptoms. Counseling on the prevention of dehydration was also provided today. Angela Koch is at risk for dehydration due to weight loss, lifestyle and behavorial habits and possibly due to taking certain medication(s). She  was encouraged to adequately hydrate and monitor fluid status to avoid dehydration as well as weight loss plateaus.  Unless pre-existing renal or cardiopulmonary conditions exist, in which patient was told to limit their fluid intake, I recommended roughly one half of their weight in pounds to be the approximate ounces of non-caloric, non-caffeinated beverages they should drink per day; including more if they are engaging in exercise.  Angela Koch is at higher than average risk of dehydration. Angela Koch was given more than 9 minutes of proper hydration counseling today. We discussed the signs and symptoms of dehydration, some of which may include muscle cramping, constipation, or even orthostatic symptoms.  Counseling on the prevention of dehydration was also provided today.  Angela Koch is at risk for dehydration due to weight loss, lifestyle and behavorial habits, and possibly due to taking certain medication(s).  She was encouraged to adequately hydrate and monitor fluid status to avoid dehydration as well as weight loss plateaus.  Unless pre-existing renal or cardiopulmonary conditions exist, which pt was told to limit their fluid intake.  I recommended roughly one half of their weight in pounds to be the approximate ounces of non-caloric, non-caffeinated beverages they should drink per day; including more if they are engaging in exercise.  3. Obesity with current BMI of 31 Angela Koch is currently in the action stage of change. As such, her goal is to continue with weight loss efforts. She has agreed to keeping a food journal and adhering to recommended goals of 1000-1100 calories and 80+ grams protein.  Bring in journaling log to next OV. Angela Koch will call her insurance regarding coverage for [facility] and [employer].  Exercise goals:  As is  Behavioral modification strategies: increasing lean protein intake and decreasing simple carbohydrates.  Angela Koch has agreed to follow-up with our clinic in 2 weeks. She was informed of the  importance of frequent follow-up visits to maximize her success with intensive lifestyle modifications for her multiple health conditions.   Objective:   Blood pressure 118/81, pulse 64, temperature 97.6 F (36.4 C), height '5\' 4"'$  (1.626 m), weight 180 lb (81.6 kg), last menstrual period 01/08/2015, SpO2 96 %. Body mass index is 30.9 kg/m.  General: Cooperative, alert, well developed, in no acute distress. HEENT: Conjunctivae and lids unremarkable. Cardiovascular: Regular rhythm.  Lungs: Normal work of breathing. Neurologic: No focal deficits.   Lab Results  Component Value Date   CREATININE 0.94 12/10/2021   BUN 10 12/10/2021   NA 148 (H) 12/10/2021   K 4.6 12/10/2021   CL 108 (H) 12/10/2021   CO2 22 12/10/2021   Lab Results  Component Value Date   ALT 19 12/10/2021   AST 25 12/10/2021   ALKPHOS 62 12/10/2021   BILITOT 0.2 12/10/2021   Lab Results  Component Value Date   HGBA1C 5.5 12/10/2021   Lab Results  Component Value Date   INSULIN 5.8 12/10/2021   Lab Results  Component Value Date   TSH 1.680 12/10/2021   Lab Results  Component Value Date   CHOL 284 (H) 12/10/2021   HDL 116 12/10/2021   LDLCALC 159 (H) 12/10/2021   TRIG 59 12/10/2021   Lab Results  Component Value Date   VD25OH 40.3 12/10/2021   VD25OH 24 (L) 10/22/2012   VD25OH 15 (L) 07/28/2012   Lab Results  Component Value Date   WBC 2.7 (L) 12/10/2021   HGB 13.4 12/10/2021   HCT 38.1 12/10/2021   MCV 87 12/10/2021   PLT 308 12/10/2021    Attestation Statements:   Reviewed by clinician on day of visit: allergies, medications, problem list, medical history, surgical history, family history, social history, and previous encounter notes.  I, Kathlene November, BS, CMA, am acting as transcriptionist for [employer], DO.  I have reviewed the above documentation for accuracy and completeness, and I agree with the above. Marjory Sneddon, D.O.  The [place] was signed  into law in 2016 which includes the topic of electronic health records.  This provides immediate access to information in MyChart.  This includes consultation notes, operative notes, office notes, lab results and pathology reports.  If you have any questions about what you read please let us know at your next visit so we can discuss your concerns and take corrective action if need be.  We are right here with you.

## 2022-01-29 ENCOUNTER — Encounter (INDEPENDENT_AMBULATORY_CARE_PROVIDER_SITE_OTHER): Payer: Self-pay | Admitting: Family Medicine

## 2022-01-29 ENCOUNTER — Ambulatory Visit (INDEPENDENT_AMBULATORY_CARE_PROVIDER_SITE_OTHER): Payer: 59 | Admitting: Family Medicine

## 2022-01-29 VITALS — BP 113/80 | HR 69 | Temp 98.1°F | Ht 64.0 in | Wt 178.0 lb

## 2022-01-29 DIAGNOSIS — E7849 Other hyperlipidemia: Secondary | ICD-10-CM

## 2022-01-29 DIAGNOSIS — Z6830 Body mass index (BMI) 30.0-30.9, adult: Secondary | ICD-10-CM

## 2022-01-29 DIAGNOSIS — E669 Obesity, unspecified: Secondary | ICD-10-CM

## 2022-01-29 DIAGNOSIS — E559 Vitamin D deficiency, unspecified: Secondary | ICD-10-CM | POA: Diagnosis not present

## 2022-02-06 NOTE — Progress Notes (Unsigned)
Chief Complaint:   OBESITY Angela Koch is here to discuss her progress with her obesity treatment plan along with follow-up of her obesity related diagnoses. Angela Koch is on keeping a food journal and adhering to recommended goals of 1000-1100 calories and 80+ grams protein and states she is following her eating plan approximately 70% of the time. Angela Koch states she is doing cardio and weights 30 minutes 2 times per week.  Today's visit was #: 4 Starting weight: 181 lbs Starting date: 12/10/2021 Today's weight: 178 lbs Today's date: 01/29/2022 Total lbs lost to date: 3 Total lbs lost since last in-office visit: 2  Interim History: Angela Koch brought her food journal in today and has 7 days down. She had 80 grams of protein per day but some days under in calories and some days over. Pt is eating walnuts, avocado, and pork bacon, which are all very high calorie foods.  Subjective:   1. Other hyperlipidemia Angela Koch is reminded of her condition of HLD. Medication: None  2. Vitamin D deficiency She increased her OTC Vitamin D 2,000 IU to 4,000 IU daily about 2 months ago.   Assessment/Plan:   1. Other hyperlipidemia Cardiovascular risk and specific lipid/LDL goals reviewed.  We discussed several lifestyle modifications today and Angela Koch will continue to work on diet, exercise and weight loss efforts. Orders and follow up as documented in patient record.  Decrease saturated and trans fats. We discussed lifestyle modifications that are needed to improve levels.  Counseling Intensive lifestyle modifications are the first line treatment for this issue. Dietary changes: Increase soluble fiber. Decrease simple carbohydrates. Exercise changes: Moderate to vigorous-intensity aerobic activity 150 minutes per week if tolerated. Lipid-lowering medications: see documented in medical record.  2. Vitamin D deficiency Low Vitamin D level contributes to fatigue and are associated with obesity, breast, and colon  cancer. She agrees to continue to take OTC Vitamin D 4,000 IU daily and will follow-up for routine testing of Vitamin D, at least 2-3 times per year to avoid over-replacement.  3. Obesity with current BMI of 30.6 Angela Koch is currently in the action stage of change. As such, her goal is to continue with weight loss efforts. She has agreed to keeping a food journal and adhering to recommended goals of 1000 calories and 80+ grams protein.   Bring in journaling log of total calories and proteins for each day. Pt called insurance and found out that she doesn't have weight loss med coverage. Pt has no history of insulin resistance, pre-diabetes, or glucose intolerance.  Exercise goals:  As is  Behavioral modification strategies: increasing lean protein intake, decreasing simple carbohydrates, and keeping a strict food journal.  Angela Koch has agreed to follow-up with our clinic in 2-3 weeks with Dr. Jearld Shines. She was informed of the importance of frequent follow-up visits to maximize her success with intensive lifestyle modifications for her multiple health conditions.   Objective:   Blood pressure 113/80, pulse 69, temperature 98.1 F (36.7 C), height '5\' 4"'$  (1.626 m), weight 178 lb (80.7 kg), last menstrual period 01/08/2015, SpO2 98 %. Body mass index is 30.55 kg/m.  General: Cooperative, alert, well developed, in no acute distress. HEENT: Conjunctivae and lids unremarkable. Cardiovascular: Regular rhythm.  Lungs: Normal work of breathing. Neurologic: No focal deficits.   Lab Results  Component Value Date   CREATININE 0.94 12/10/2021   BUN 10 12/10/2021   NA 148 (H) 12/10/2021   K 4.6 12/10/2021   CL 108 (H) 12/10/2021   CO2 22  12/10/2021   Lab Results  Component Value Date   ALT 19 12/10/2021   AST 25 12/10/2021   ALKPHOS 62 12/10/2021   BILITOT 0.2 12/10/2021   Lab Results  Component Value Date   HGBA1C 5.5 12/10/2021   Lab Results  Component Value Date   INSULIN 5.8 12/10/2021    Lab Results  Component Value Date   TSH 1.680 12/10/2021   Lab Results  Component Value Date   CHOL 284 (H) 12/10/2021   HDL 116 12/10/2021   LDLCALC 159 (H) 12/10/2021   TRIG 59 12/10/2021   Lab Results  Component Value Date   VD25OH 40.3 12/10/2021   VD25OH 24 (L) 10/22/2012   VD25OH 15 (L) 07/28/2012   Lab Results  Component Value Date   WBC 2.7 (L) 12/10/2021   HGB 13.4 12/10/2021   HCT 38.1 12/10/2021   MCV 87 12/10/2021   PLT 308 12/10/2021    Attestation Statements:   Reviewed by clinician on day of visit: allergies, medications, problem list, medical history, surgical history, family history, social history, and previous encounter notes.  Time spent on visit including pre-visit chart review and post-visit care and charting was 30 minutes.   I, Kathlene November, BS, CMA, am acting as transcriptionist for [employer], DO.  I have reviewed the above documentation for accuracy and completeness, and I agree with the above. -  ***

## 2022-02-11 ENCOUNTER — Encounter: Payer: Self-pay | Admitting: Physical Therapy

## 2022-02-11 ENCOUNTER — Ambulatory Visit: Payer: 59 | Attending: Physician Assistant | Admitting: Physical Therapy

## 2022-02-11 DIAGNOSIS — M25562 Pain in left knee: Secondary | ICD-10-CM | POA: Diagnosis present

## 2022-02-11 DIAGNOSIS — M5459 Other low back pain: Secondary | ICD-10-CM | POA: Insufficient documentation

## 2022-02-11 DIAGNOSIS — M79605 Pain in left leg: Secondary | ICD-10-CM | POA: Diagnosis present

## 2022-02-11 DIAGNOSIS — R262 Difficulty in walking, not elsewhere classified: Secondary | ICD-10-CM | POA: Diagnosis present

## 2022-02-11 NOTE — Therapy (Signed)
OUTPATIENT PHYSICAL THERAPY THORACOLUMBAR EVALUATION   Patient Name: Angela Koch MRN: [id number] DOB:26-Oct-1960, 61 y.o., female Today's Date: 02/11/2022   PT End of Session - 02/11/22 0749     Visit Number 1    Date for PT Re-Evaluation 05/14/22    Authorization Type [employer]    PT Start Time 0745    PT Stop Time 0834    PT Time Calculation (min) 49 min    Activity Tolerance Patient tolerated treatment well    Behavior During Therapy [facility] for tasks assessed/performed             Past Medical History:  Diagnosis Date   Allergy    seasonal   Anemia    H/O   Arthritis    knees   Back pain    BV (bacterial vaginosis)    H/O   H/O vitamin D deficiency    Hyperlipidemia    borderline - no meds   Knee pain    Leg pain    Personal history of colonic polyp - adenoma 01/31/2014   Spinal stenosis    Vaginal yeast infection    RECURRENT   Past Surgical History:  Procedure Laterality Date   COLONOSCOPY     POLYPECTOMY     WISDOM TOOTH EXTRACTION     Patient Active Problem List   Diagnosis Date Noted   Other hyperlipidemia 01/04/2022   Abnormal white blood cell (WBC) count 01/04/2022   Vitamin D deficiency 12/10/2021   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   Chronic low back pain 12/10/2021   Personal history of colonic polyp - adenoma 01/31/2014    PCP: Suzanna Obey  REFERRING PROVIDER: Dubicki  REFERRING DIAG: low back pain with left leg and knee pain  Rationale for Evaluation and Treatment Rehabilitation  THERAPY DIAG:  Other low back pain  Pain in left leg  Difficulty in walking, not elsewhere classified  Acute pain of left knee  ONSET DATE: October 02, 2021  SUBJECTIVE:                                                                                                                                                                                           SUBJECTIVE STATEMENT: Patient reports that she has lumbar stenosis.  She reports that in February  she started rally having left leg and back pain, she recently had an injection in the low back and the back is feeling better, she continues with left knee pain, she reports that the MD felt like she had some inflammation. PERTINENT HISTORY:  Back pain, lumbar stenosis  PAIN:  Are you having pain? Yes: NPRS scale: 7/10 Pain  location: left buttock. Posterior lateral leg and into the shins Pain description: tightness, spasm, ache, pain up to 9-10/10 Aggravating factors: stairs,walking  Relieving factors: stretch the injection   PRECAUTIONS: None  WEIGHT BEARING RESTRICTIONS No  FALLS:  Has patient fallen in last 6 months? No  LIVING ENVIRONMENT: Lives with: lives with their family Lives in: House/apartment Stairs: Yes: Internal:   steps; can reach both Has following equipment at home: None    OCCUPATION: sitting  PLOF: Independent  does housework, was walking some  PATIENT GOALS have less pain   OBJECTIVE:   DIAGNOSTIC FINDINGS:  X-rays spinal stenosis  PATIENT SURVEYS:  FOTO 51  COGNITION:  Overall cognitive status: Within functional limits for tasks assessed     SENSATION: WFL  MUSCLE LENGTH: Mild tightness in the piriformis, tight hip flexor and quad POSTURE: rounded shoulders and forward head  PALPATION: Tight in the low back and the left leg  LUMBAR ROM:   Active  A/PROM  eval  Flexion WFL  Extension Decreased 25%  Right lateral flexion WFL  Left lateral flexion WFL  Right rotation   Left rotation    (Blank rows = not tested)  LOWER EXTREMITY ROM:     WFL's some clicking and popping in the left knee  LOWER EXTREMITY MMT:    MMT Right eval Left eval  Hip flexion  4+  Hip extension    Hip abduction  4+  Hip adduction    Hip internal rotation    Hip external rotation    Knee flexion  4 with pain  Knee extension  4+  Ankle dorsiflexion    Ankle plantarflexion    Ankle inversion    Ankle eversion     (Blank rows = not  tested)  LUMBAR SPECIAL TESTS:  Marcello Moores test: Positive  GAIT:   No device, has mild antalgic gait on the left, reports that she was limping significantly prior to the injection   TODAY'S TREATMENT  HEP   PATIENT EDUCATION:  Education details: see below Person educated: Patient Education method: Explanation, Demonstration, Verbal cues, and Handouts Education comprehension: verbalized understanding   HOME EXERCISE PROGRAM: Access Code: [email or web address] URL: [email or web address] Date: 02/11/2022 Prepared by: Lum Babe  Exercises - Supine Lower Trunk Rotation  - 2 x daily - 7 x weekly - 1 sets - 10 reps - 10 hold - Hooklying Single Knee to Chest  - 2 x daily - 7 x weekly - 1 sets - 10 reps - 10 hold - Supine Double Knee to Chest  - 2 x daily - 7 x weekly - 1 sets - 10 reps - 10 hold - Supine Piriformis Stretch Pulling Heel to Hip  - 2 x daily - 7 x weekly - 1 sets - 10 reps - 10 hold - Supine ITB Stretch with Strap  - 2 x daily - 7 x weekly - 1 sets - 10 reps - 10 hold - Supine Quadriceps Stretch with Strap on Table  - 2 x daily - 7 x weekly - 1 sets - 10 reps - 10 hold - Doorway Kneeling Hip Flexor Stretch  - 2 x daily - 7 x weekly - 1 sets - 10 reps - 10 hold - Standing Gastroc Stretch on Step  - 2 x daily - 7 x weekly - 1 sets - 10 reps - 10 hold  ASSESSMENT:  CLINICAL IMPRESSION: Patient is a 61 y.o. female who was seen today for physical therapy evaluation and treatment for back  pain and left leg pain.  An injection two weeks ago has really helped the low back but she reports left leg pain and tightness.  She has tight piriformis, quad, hip flexor and ITB and calves.  Reports that she has difficulty walking     OBJECTIVE IMPAIRMENTS Abnormal gait, decreased activity tolerance, decreased endurance, decreased mobility, difficulty walking, decreased ROM, decreased strength, increased muscle spasms, impaired flexibility, and pain.   REHAB POTENTIAL:  Good  CLINICAL DECISION MAKING: Stable/uncomplicated  EVALUATION COMPLEXITY: Low   GOALS: Goals reviewed with patient? Yes  SHORT TERM GOALS: Target date: 02/25/22  Independent with initial HEP Goal status: INITIAL  LONG TERM GOALS: Target date: 05/06/22  Understand posture and body mechanics Goal status: INITIAL  2.  No difficulty walking 20 minutes Goal status: INITIAL  3.  Decrease pain in the leg 50% Goal status: INITIAL  4.  Independent with advanced HEP or gym Goal status: INITIAL   PLAN: PT FREQUENCY: 1-2x/week  PT DURATION: 12 weeks  PLANNED INTERVENTIONS: Therapeutic exercises, Therapeutic activity, Neuromuscular re-education, Balance training, Gait training, Patient/Family education, Joint mobilization, Dry Needling, Electrical stimulation, Spinal mobilization, Cryotherapy, Moist heat, Taping, Traction, and Manual therapy.  PLAN FOR NEXT SESSION: start gym activity   Sumner Boast, PT 02/11/2022, 9:06 AM

## 2022-02-12 ENCOUNTER — Ambulatory Visit (INDEPENDENT_AMBULATORY_CARE_PROVIDER_SITE_OTHER): Payer: 59 | Admitting: Family Medicine

## 2022-02-12 ENCOUNTER — Encounter (INDEPENDENT_AMBULATORY_CARE_PROVIDER_SITE_OTHER): Payer: Self-pay | Admitting: Family Medicine

## 2022-02-12 VITALS — BP 120/85 | HR 68 | Temp 97.6°F | Ht 64.0 in | Wt 173.0 lb

## 2022-02-12 DIAGNOSIS — E559 Vitamin D deficiency, unspecified: Secondary | ICD-10-CM

## 2022-02-12 DIAGNOSIS — E669 Obesity, unspecified: Secondary | ICD-10-CM

## 2022-02-12 DIAGNOSIS — Z6829 Body mass index (BMI) 29.0-29.9, adult: Secondary | ICD-10-CM | POA: Diagnosis not present

## 2022-02-12 DIAGNOSIS — E7849 Other hyperlipidemia: Secondary | ICD-10-CM

## 2022-02-21 ENCOUNTER — Ambulatory Visit: Payer: 59 | Admitting: Physical Therapy

## 2022-02-21 ENCOUNTER — Encounter: Payer: Self-pay | Admitting: Physical Therapy

## 2022-02-21 DIAGNOSIS — M5459 Other low back pain: Secondary | ICD-10-CM

## 2022-02-21 DIAGNOSIS — R262 Difficulty in walking, not elsewhere classified: Secondary | ICD-10-CM

## 2022-02-21 DIAGNOSIS — M25562 Pain in left knee: Secondary | ICD-10-CM

## 2022-02-21 DIAGNOSIS — M79605 Pain in left leg: Secondary | ICD-10-CM

## 2022-02-21 NOTE — Therapy (Signed)
OUTPATIENT PHYSICAL THERAPY THORACOLUMBAR EVALUATION   Patient Name: Angela Koch MRN: [id number] DOB:1961/08/03, 61 y.o., female Today's Date: 02/21/2022   PT End of Session - 02/21/22 0809     Visit Number 2    Date for PT Re-Evaluation 05/14/22    PT Start Time 0805    PT Stop Time 0845    PT Time Calculation (min) 40 min    Activity Tolerance Patient tolerated treatment well    Behavior During Therapy [facility] for tasks assessed/performed             Past Medical History:  Diagnosis Date   Allergy    seasonal   Anemia    H/O   Arthritis    knees   Back pain    BV (bacterial vaginosis)    H/O   H/O vitamin D deficiency    Hyperlipidemia    borderline - no meds   Knee pain    Leg pain    Personal history of colonic polyp - adenoma 01/31/2014   Spinal stenosis    Vaginal yeast infection    RECURRENT   Past Surgical History:  Procedure Laterality Date   COLONOSCOPY     POLYPECTOMY     WISDOM TOOTH EXTRACTION     Patient Active Problem List   Diagnosis Date Noted   Other hyperlipidemia 01/04/2022   Abnormal white blood cell (WBC) count 01/04/2022   Vitamin D deficiency 12/10/2021   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   Chronic low back pain 12/10/2021   Personal history of colonic polyp - adenoma 01/31/2014    PCP: Suzanna Obey  REFERRING PROVIDER: Dubicki  REFERRING DIAG: low back pain with left leg and knee pain  Rationale for Evaluation and Treatment Rehabilitation  THERAPY DIAG:  Other low back pain  Pain in left leg  Difficulty in walking, not elsewhere classified  Acute pain of left knee  ONSET DATE: October 02, 2021  SUBJECTIVE:                                                                                                                                                                                           SUBJECTIVE STATEMENT: I've been having a good day today and am not hurting.   PERTINENT HISTORY:  Back pain, lumbar  stenosis  PAIN:  Are you having pain? No   PRECAUTIONS: None  WEIGHT BEARING RESTRICTIONS No  FALLS:  Has patient fallen in last 6 months? No  LIVING ENVIRONMENT: Lives with: lives with their family Lives in: House/apartment Stairs: Yes: Internal:   steps; can reach both Has following equipment at home: None    OCCUPATION:  sitting  PLOF: Independent  does housework, was walking some  PATIENT GOALS have less pain   OBJECTIVE:   DIAGNOSTIC FINDINGS:  X-rays spinal stenosis  PATIENT SURVEYS:  FOTO 51  COGNITION:  Overall cognitive status: Within functional limits for tasks assessed     SENSATION: WFL  MUSCLE LENGTH: Mild tightness in the piriformis, tight hip flexor and quad POSTURE: rounded shoulders and forward head  PALPATION: Tight in the low back and the left leg  LUMBAR ROM:   Active  A/PROM  eval  Flexion WFL  Extension Decreased 25%  Right lateral flexion WFL  Left lateral flexion WFL  Right rotation   Left rotation    (Blank rows = not tested)  LOWER EXTREMITY ROM:     WFL's some clicking and popping in the left knee  LOWER EXTREMITY MMT:    MMT Right eval Left eval  Hip flexion  4+  Hip extension    Hip abduction  4+  Hip adduction    Hip internal rotation    Hip external rotation    Knee flexion  4 with pain  Knee extension  4+  Ankle dorsiflexion    Ankle plantarflexion    Ankle inversion    Ankle eversion     (Blank rows = not tested)  LUMBAR SPECIAL TESTS:  Marcello Moores test: Positive  GAIT:   No device, has mild antalgic gait on the left, reports that she was limping significantly prior to the injection   TODAY'S TREATMENT  02/21/22 NuStep L5 x 6 min, LE's only   Stretch hamstrings/piriformis 2x15" B   KTC on ball, then obliques 2x10   Rows with red TB x10, verbal cues to avoid shoulder abduction   Seated clams with green TB 2x10   Rows/lat pull downs 20#/25# 2x10   Back extension/flexion with black TB  2x10   Resisted gait 82# forward, clicking in left knee, but no pain  PATIENT EDUCATION:  Education details: verb compliance Person educated: Patient Education method: Explanation, Demonstration, Verbal cues, and Handouts Education comprehension: verbalized understanding   HOME EXERCISE PROGRAM: Access Code: [email or web address] URL: [email or web address] Date: 02/11/2022 Prepared by: Lum Babe  Exercises - Supine Lower Trunk Rotation  - 2 x daily - 7 x weekly - 1 sets - 10 reps - 10 hold - Hooklying Single Knee to Chest  - 2 x daily - 7 x weekly - 1 sets - 10 reps - 10 hold - Supine Double Knee to Chest  - 2 x daily - 7 x weekly - 1 sets - 10 reps - 10 hold - Supine Piriformis Stretch Pulling Heel to Hip  - 2 x daily - 7 x weekly - 1 sets - 10 reps - 10 hold - Supine ITB Stretch with Strap  - 2 x daily - 7 x weekly - 1 sets - 10 reps - 10 hold - Supine Quadriceps Stretch with Strap on Table  - 2 x daily - 7 x weekly - 1 sets - 10 reps - 10 hold - Doorway Kneeling Hip Flexor Stretch  - 2 x daily - 7 x weekly - 1 sets - 10 reps - 10 hold - Standing Gastroc Stretch on Step  - 2 x daily - 7 x weekly - 1 sets - 10 reps - 10 hold  ASSESSMENT:  CLINICAL IMPRESSION:  Pt was five minutes late today. Pt enters today stating she feels a lot better than she did last time. She denies any pain and states she  just feels tight in her hips. She states walking for extended period of time agitates her left knee. Piriformis and hamstrings seemed to have good flexibility today so we focused on some overall strengthening. Pt states standing exercises are what bother her knee the most. Pt needed verbal cues for rows.   OBJECTIVE IMPAIRMENTS Abnormal gait, decreased activity tolerance, decreased endurance, decreased mobility, difficulty walking, decreased ROM, decreased strength, increased muscle spasms, impaired flexibility, and pain.   REHAB POTENTIAL: Good  CLINICAL DECISION MAKING:  Stable/uncomplicated  EVALUATION COMPLEXITY: Low   GOALS: Goals reviewed with patient? Yes  SHORT TERM GOALS: Target date: 02/25/22  Independent with initial HEP Goal status: INITIAL  LONG TERM GOALS: Target date: 05/06/22  Understand posture and body mechanics Goal status: INITIAL  2.  No difficulty walking 20 minutes Goal status: INITIAL  3.  Decrease pain in the leg 50% Goal status: INITIAL  4.  Independent with advanced HEP or gym Goal status: INITIAL   PLAN: PT FREQUENCY: 1-2x/week  PT DURATION: 12 weeks  PLANNED INTERVENTIONS: Therapeutic exercises, Therapeutic activity, Neuromuscular re-education, Balance training, Gait training, Patient/Family education, Joint mobilization, Dry Needling, Electrical stimulation, Spinal mobilization, Cryotherapy, Moist heat, Taping, Traction, and Manual therapy.  PLAN FOR NEXT SESSION: Additional strengthening exercises.   [place] 02/21/2022, 8:11 AM

## 2022-02-27 ENCOUNTER — Other Ambulatory Visit (HOSPITAL_COMMUNITY): Payer: Self-pay

## 2022-02-27 ENCOUNTER — Other Ambulatory Visit (INDEPENDENT_AMBULATORY_CARE_PROVIDER_SITE_OTHER): Payer: Self-pay | Admitting: Family Medicine

## 2022-02-27 ENCOUNTER — Ambulatory Visit (INDEPENDENT_AMBULATORY_CARE_PROVIDER_SITE_OTHER): Payer: 59 | Admitting: Family Medicine

## 2022-02-27 ENCOUNTER — Encounter (INDEPENDENT_AMBULATORY_CARE_PROVIDER_SITE_OTHER): Payer: Self-pay | Admitting: Family Medicine

## 2022-02-27 VITALS — BP 125/86 | HR 79 | Temp 97.8°F | Ht 64.0 in | Wt 173.0 lb

## 2022-02-27 DIAGNOSIS — R638 Other symptoms and signs concerning food and fluid intake: Secondary | ICD-10-CM

## 2022-02-27 DIAGNOSIS — Z9189 Other specified personal risk factors, not elsewhere classified: Secondary | ICD-10-CM

## 2022-02-27 DIAGNOSIS — E559 Vitamin D deficiency, unspecified: Secondary | ICD-10-CM

## 2022-02-27 DIAGNOSIS — E8881 Metabolic syndrome: Secondary | ICD-10-CM | POA: Diagnosis not present

## 2022-02-27 DIAGNOSIS — E669 Obesity, unspecified: Secondary | ICD-10-CM | POA: Diagnosis not present

## 2022-02-27 DIAGNOSIS — F5089 Other specified eating disorder: Secondary | ICD-10-CM | POA: Diagnosis not present

## 2022-02-27 DIAGNOSIS — Z6829 Body mass index (BMI) 29.0-29.9, adult: Secondary | ICD-10-CM

## 2022-02-27 MED ORDER — TOPIRAMATE 25 MG PO TABS: ORAL_TABLET | ORAL | 0 refills | Status: DC

## 2022-02-27 MED ORDER — TOPIRAMATE 25 MG PO TABS
ORAL_TABLET | ORAL | 0 refills | Status: AC
  Filled 2022-02-27 (×3): qty 30, 30d supply, fill #0

## 2022-02-28 ENCOUNTER — Ambulatory Visit: Payer: 59 | Attending: Physician Assistant | Admitting: Physical Therapy

## 2022-02-28 ENCOUNTER — Encounter: Payer: Self-pay | Admitting: Physical Therapy

## 2022-02-28 DIAGNOSIS — R262 Difficulty in walking, not elsewhere classified: Secondary | ICD-10-CM | POA: Diagnosis present

## 2022-02-28 DIAGNOSIS — M5459 Other low back pain: Secondary | ICD-10-CM | POA: Insufficient documentation

## 2022-02-28 DIAGNOSIS — M79605 Pain in left leg: Secondary | ICD-10-CM | POA: Insufficient documentation

## 2022-02-28 DIAGNOSIS — M25562 Pain in left knee: Secondary | ICD-10-CM | POA: Insufficient documentation

## 2022-02-28 NOTE — Progress Notes (Unsigned)
Chief Complaint:   OBESITY Angela Koch is here to discuss her progress with her obesity treatment plan along with follow-up of her obesity related diagnoses. Angela Koch is on keeping a food journal and adhering to recommended goals of 1000 calories and 100+ protein and states she is following her eating plan approximately 80% of the time. Angela Koch states she is not exercising.  Today's visit was #: 6 Starting weight: 181 lbs Starting date: 12/10/2021 Today's weight: 173 lbs Today's date: 02/27/2022 Total lbs lost to date: 8 lbs Total lbs lost since last in-office visit: 0  Interim History: Angela Koch has found it helpful to journal most days.  She has hit her calorie and protein goals except for 2 days.  She denies any hunger, but still has cravings in the evenings before dinner.  (Chips or ice cream).  Subjective:   1. Insulin resistance Discussed with Angela Koch again what insulin resistance is.  We discussed this after her last labs were done about 2 months ago.  Her fasting insulin is greater than 5.0.  2. Abnormal craving Cravings for specific foods.   3. Vitamin D deficiency She is currently taking OTC vitamin D 4,000 IU each day. She denies nausea, vomiting or muscle weakness.  Labs drawn on 12/10/2021.  Vitamin D was at 40.3 at that time.  4. At risk for side effect of medication Liticia is at risk for drug side effects due to starting Topiramate today.   Assessment/Plan:  No orders of the defined types were placed in this encounter.   Medications Discontinued During This Encounter  Medication Reason   topiramate (TOPAMAX) 25 MG tablet      Meds ordered this encounter  Medications   DISCONTD: topiramate (TOPAMAX) 25 MG tablet    Sig: 1 po qd with lunch    Dispense:  30 tablet    Refill:  0   topiramate (TOPAMAX) 25 MG tablet    Sig: Take 1 tablet by mouth daily with lunch.    Dispense:  30 tablet    Refill:  0     1. Insulin resistance Continue prudent nutritional plan and  weight loss. Decrease simple carbs and increase protein.  Counseling has been done.  2. Abnormal craving Risks and benefits discussed with Angela Koch.   Start trial of - topiramate (TOPAMAX) 25 MG tablet; Take 1 tablet by mouth daily with lunch.  Dispense: 30 tablet; Refill: 0  3. Vitamin D deficiency Angela Koch will continue to work on weight loss, exercise, and decreasing simple carbohydrates to help decrease the risk of diabetes. Angela Koch agreed to follow-up with [place] as directed to closely monitor her progress.  Continue Vitamin D 4,000 IU daily.  Recheck labs after 3 months of consistent use.    4. At risk for side effect of medication Angela Koch was given approximately 9 minutes of drug side effect counseling today.  We discussed side effect possibility and risk versus benefits. Angela Koch agreed to the medication and will contact this office if these side effects are intolerable.  Repetitive spaced learning was employed today to elicit superior memory formation and behavioral change.   5. Obesity with current BMI of 29.8 Getting in 90+ oz of water per day.   Angela Koch is currently in the action stage of change. As such, her goal is to continue with weight loss efforts. She has agreed to keeping a food journal and adhering to recommended goals of 1000 calories and 100+ protein daily.   Exercise goals: All adults should avoid  inactivity. Some physical activity is better than none, and adults who participate in any amount of physical activity gain some health benefits.  Behavioral modification strategies: planning for success and keeping a strict food journal.  Angela Koch has agreed to follow-up with our clinic in 3-4 weeks. She was informed of the importance of frequent follow-up visits to maximize her success with intensive lifestyle modifications for her multiple health conditions.   Objective:   Blood pressure 125/86, pulse 79, temperature 97.8 F (36.6 C), height '5\' 4"'$  (1.626 m), weight 173 lb (78.5 kg), last  menstrual period 01/08/2015, SpO2 100 %. Body mass index is 29.7 kg/m.  General: Cooperative, alert, well developed, in no acute distress. HEENT: Conjunctivae and lids unremarkable. Cardiovascular: Regular rhythm.  Lungs: Normal work of breathing. Neurologic: No focal deficits.   Lab Results  Component Value Date   CREATININE 0.94 12/10/2021   BUN 10 12/10/2021   NA 148 (H) 12/10/2021   K 4.6 12/10/2021   CL 108 (H) 12/10/2021   CO2 22 12/10/2021   Lab Results  Component Value Date   ALT 19 12/10/2021   AST 25 12/10/2021   ALKPHOS 62 12/10/2021   BILITOT 0.2 12/10/2021   Lab Results  Component Value Date   HGBA1C 5.5 12/10/2021   Lab Results  Component Value Date   INSULIN 5.8 12/10/2021   Lab Results  Component Value Date   TSH 1.680 12/10/2021   Lab Results  Component Value Date   CHOL 284 (H) 12/10/2021   HDL 116 12/10/2021   LDLCALC 159 (H) 12/10/2021   TRIG 59 12/10/2021   Lab Results  Component Value Date   VD25OH 40.3 12/10/2021   VD25OH 24 (L) 10/22/2012   VD25OH 15 (L) 07/28/2012   Lab Results  Component Value Date   WBC 2.7 (L) 12/10/2021   HGB 13.4 12/10/2021   HCT 38.1 12/10/2021   MCV 87 12/10/2021   PLT 308 12/10/2021   No results found for: "IRON", "TIBC", "FERRITIN"  Attestation Statements:   Reviewed by clinician on day of visit: allergies, medications, problem list, medical history, surgical history, family history, social history, and previous encounter notes.  I, Davy Pique, RMA, am acting as Location manager for [employer], DO.   I have reviewed the above documentation for accuracy and completeness, and I agree with the above. Marjory Sneddon, D.O.  The [place] was signed into law in 2016 which includes the topic of electronic health records.  This provides immediate access to information in MyChart.  This includes consultation notes, operative notes, office notes, lab results and pathology reports.  If  you have any questions about what you read please let us know at your next visit so we can discuss your concerns and take corrective action if need be.  We are right here with you.

## 2022-02-28 NOTE — Therapy (Signed)
OUTPATIENT PHYSICAL THERAPY THORACOLUMBAR TREATMENT   Patient Name: Angela Koch MRN: [id number] DOB:1961-02-15, 61 y.o., female Today's Date: 02/28/2022   PT End of Session - 02/28/22 0845     Visit Number 3    Date for PT Re-Evaluation 05/14/22    PT Start Time 0845    PT Stop Time 0930    PT Time Calculation (min) 45 min    Activity Tolerance Patient tolerated treatment well    Behavior During Therapy [facility] for tasks assessed/performed             Past Medical History:  Diagnosis Date   Allergy    seasonal   Anemia    H/O   Arthritis    knees   Back pain    BV (bacterial vaginosis)    H/O   H/O vitamin D deficiency    Hyperlipidemia    borderline - no meds   Knee pain    Leg pain    Personal history of colonic polyp - adenoma 01/31/2014   Spinal stenosis    Vaginal yeast infection    RECURRENT   Past Surgical History:  Procedure Laterality Date   COLONOSCOPY     POLYPECTOMY     WISDOM TOOTH EXTRACTION     Patient Active Problem List   Diagnosis Date Noted   Other hyperlipidemia 01/04/2022   Abnormal white blood cell (WBC) count 01/04/2022   Vitamin D deficiency 12/10/2021   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   Chronic low back pain 12/10/2021   Personal history of colonic polyp - adenoma 01/31/2014    PCP: Suzanna Obey  REFERRING PROVIDER: [place]  REFERRING DIAG: low back pain with left leg and knee pain  Rationale for Evaluation and Treatment Rehabilitation  THERAPY DIAG:  Other low back pain  Pain in left leg  Difficulty in walking, not elsewhere classified  Acute pain of left knee  ONSET DATE: October 02, 2021  SUBJECTIVE:                                                                                                                                                                                           SUBJECTIVE STATEMENT:  When I came in today and was sitting in the lobby my back was hurting just a little but its fine now.    PERTINENT HISTORY:  Back pain, lumbar stenosis  PAIN:  Are you having pain? No   PRECAUTIONS: None  WEIGHT BEARING RESTRICTIONS No  FALLS:  Has patient fallen in last 6 months? No  LIVING ENVIRONMENT: Lives with: lives with their family Lives in: House/apartment Stairs: Yes: Internal:   steps; can  reach both Has following equipment at home: None    OCCUPATION: sitting  PLOF: Independent  does housework, was walking some  PATIENT GOALS have less pain   OBJECTIVE:   DIAGNOSTIC FINDINGS:  X-rays spinal stenosis  PATIENT SURVEYS:  FOTO 51  COGNITION:  Overall cognitive status: Within functional limits for tasks assessed     SENSATION: WFL  MUSCLE LENGTH: Mild tightness in the piriformis, tight hip flexor and quad POSTURE: rounded shoulders and forward head  PALPATION: Tight in the low back and the left leg  LUMBAR ROM:   Active  A/PROM  eval  Flexion WFL  Extension Decreased 25%  Right lateral flexion WFL  Left lateral flexion WFL  Right rotation   Left rotation    (Blank rows = not tested)  LOWER EXTREMITY ROM:     WFL's some clicking and popping in the left knee  LOWER EXTREMITY MMT:    MMT Right eval Left eval  Hip flexion  4+  Hip extension    Hip abduction  4+  Hip adduction    Hip internal rotation    Hip external rotation    Knee flexion  4 with pain  Knee extension  4+  Ankle dorsiflexion    Ankle plantarflexion    Ankle inversion    Ankle eversion     (Blank rows = not tested)  LUMBAR SPECIAL TESTS:  Marcello Moores test: Positive  GAIT:   No device, has mild antalgic gait on the left, reports that she was limping significantly prior to the injection   TODAY'S TREATMENT  02/28/22 Recumbent bike L3 x 6 min Ambulate outside around back building x2   Rows/lat pull downs 25#/35# 2x10   Lumbar flexion with black TB 2x10   AR press with 5# 2x10 each way   Stretching piriformis/hamstrings 2x30" B each   Gastroc stretch on bar  2x30"  02/21/22 NuStep L5 x 6 min, LE's only   Stretch hamstrings/piriformis 2x15" B   KTC on ball, then obliques 2x10   Rows with red TB x10, verbal cues to avoid shoulder abduction   Seated clams with green TB 2x10   Rows/lat pull downs 20#/25# 2x10   Back extension/flexion with black TB 2x10   Resisted gait 96# forward, clicking in left knee, but no pain  PATIENT EDUCATION:  Education details: verb compliance Person educated: Patient Education method: Explanation, Demonstration, Verbal cues, and Handouts Education comprehension: verbalized understanding   HOME EXERCISE PROGRAM: Access Code: [email or web address] URL: [email or web address] Date: 02/11/2022 Prepared by: Lum Babe  Exercises - Supine Lower Trunk Rotation  - 2 x daily - 7 x weekly - 1 sets - 10 reps - 10 hold - Hooklying Single Knee to Chest  - 2 x daily - 7 x weekly - 1 sets - 10 reps - 10 hold - Supine Double Knee to Chest  - 2 x daily - 7 x weekly - 1 sets - 10 reps - 10 hold - Supine Piriformis Stretch Pulling Heel to Hip  - 2 x daily - 7 x weekly - 1 sets - 10 reps - 10 hold - Supine ITB Stretch with Strap  - 2 x daily - 7 x weekly - 1 sets - 10 reps - 10 hold - Supine Quadriceps Stretch with Strap on Table  - 2 x daily - 7 x weekly - 1 sets - 10 reps - 10 hold - Doorway Kneeling Hip Flexor Stretch  - 2 x daily - 7 x weekly - 1  sets - 10 reps - 10 hold - Standing Gastroc Stretch on Step  - 2 x daily - 7 x weekly - 1 sets - 10 reps - 10 hold  ASSESSMENT:  CLINICAL IMPRESSION:   Pt enters today reporting no pain but does say when she was sitting in the lobby she felt a slight uncomfortable feeling in her left hip. States her pain has been no more than 5/10 over the last week and is intermittent and short lasting. Pt states she can put her shoes on without pain now, which was painful to perform before PT. She is tolerating walking for extended periods of time better, but still needs improvement. She  states she occasionally has left knee pain when walking or going up and down stairs. Needed verbal cues for posture during AR press. Would benefit from additional strengthening and ambulating to help with activity tolerance and improvement of pain.   OBJECTIVE IMPAIRMENTS Abnormal gait, decreased activity tolerance, decreased endurance, decreased mobility, difficulty walking, decreased ROM, decreased strength, increased muscle spasms, impaired flexibility, and pain.   REHAB POTENTIAL: Good  CLINICAL DECISION MAKING: Stable/uncomplicated  EVALUATION COMPLEXITY: Low   GOALS: Goals reviewed with patient? Yes  SHORT TERM GOALS: Target date: 02/25/22  Independent with initial HEP Goal status: Met  LONG TERM GOALS: Target date: 05/06/22  Understand posture and body mechanics Goal status: Progressing   2.  No difficulty walking 20 minutes Goal status: Progressing - 02/28/22 walked 10 mins without pain   3.  Decrease pain in the leg 50% Goal status: INITIAL  4.  Independent with advanced HEP or gym Goal status: INITIAL   PLAN: PT FREQUENCY: 1-2x/week  PT DURATION: 12 weeks  PLANNED INTERVENTIONS: Therapeutic exercises, Therapeutic activity, Neuromuscular re-education, Balance training, Gait training, Patient/Family education, Joint mobilization, Dry Needling, Electrical stimulation, Spinal mobilization, Cryotherapy, Moist heat, Taping, Traction, and Manual therapy.  PLAN FOR NEXT SESSION: Additional strengthening exercises.   [place], SPTA 02/28/2022, 8:46 AM

## 2022-03-07 ENCOUNTER — Encounter: Payer: Self-pay | Admitting: Physical Therapy

## 2022-03-07 ENCOUNTER — Ambulatory Visit: Payer: 59 | Admitting: Physical Therapy

## 2022-03-07 DIAGNOSIS — M25562 Pain in left knee: Secondary | ICD-10-CM

## 2022-03-07 DIAGNOSIS — M5459 Other low back pain: Secondary | ICD-10-CM | POA: Diagnosis not present

## 2022-03-07 DIAGNOSIS — R262 Difficulty in walking, not elsewhere classified: Secondary | ICD-10-CM

## 2022-03-07 DIAGNOSIS — M79605 Pain in left leg: Secondary | ICD-10-CM

## 2022-03-07 NOTE — Therapy (Signed)
OUTPATIENT PHYSICAL THERAPY THORACOLUMBAR TREATMENT   Patient Name: Angela Koch MRN: [id number] DOB:[birth year], 61 y.o., female Today's Date: 03/07/2022   PT End of Session - 03/07/22 0934     Visit Number 4    Date for PT Re-Evaluation 05/14/22    Authorization Type [employer]    PT Start Time 6378    PT Stop Time 5885    PT Time Calculation (min) 40 min    Activity Tolerance Patient tolerated treatment well    Behavior During Therapy [facility] for tasks assessed/performed             Past Medical History:  Diagnosis Date   Allergy    seasonal   Anemia    H/O   Arthritis    knees   Back pain    BV (bacterial vaginosis)    H/O   H/O vitamin D deficiency    Hyperlipidemia    borderline - no meds   Knee pain    Leg pain    Personal history of colonic polyp - adenoma 01/31/2014   Spinal stenosis    Vaginal yeast infection    RECURRENT   Past Surgical History:  Procedure Laterality Date   COLONOSCOPY     POLYPECTOMY     WISDOM TOOTH EXTRACTION     Patient Active Problem List   Diagnosis Date Noted   Other hyperlipidemia 01/04/2022   Abnormal white blood cell (WBC) count 01/04/2022   Vitamin D deficiency 12/10/2021   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   Chronic low back pain 12/10/2021   Personal history of colonic polyp - adenoma 01/31/2014    PCP: Suzanna Obey  REFERRING PROVIDER: Dubicki  REFERRING DIAG: low back pain with left leg and knee pain  Rationale for Evaluation and Treatment Rehabilitation  THERAPY DIAG:  Other low back pain  Pain in left leg  Difficulty in walking, not elsewhere classified  Acute pain of left knee  ONSET DATE: October 02, 2021  SUBJECTIVE:                                                                                                                                                                                           SUBJECTIVE STATEMENT:  I'm having a little bit of pain in my left hip down to my leg.   PERTINENT  HISTORY:  Back pain, lumbar stenosis  PAIN:  Are you having pain? 4/10 L hip, radiating down to L knee   PRECAUTIONS: None  WEIGHT BEARING RESTRICTIONS No  FALLS:  Has patient fallen in last 6 months? No  LIVING ENVIRONMENT: Lives with: lives with their family Lives in: House/apartment Stairs:  Yes: Internal:   steps; can reach both Has following equipment at home: None    OCCUPATION: sitting  PLOF: Independent  does housework, was walking some  PATIENT GOALS have less pain   OBJECTIVE:   DIAGNOSTIC FINDINGS:  X-rays spinal stenosis  PATIENT SURVEYS:  FOTO 51  COGNITION:  Overall cognitive status: Within functional limits for tasks assessed     SENSATION: WFL  MUSCLE LENGTH: Mild tightness in the piriformis, tight hip flexor and quad POSTURE: rounded shoulders and forward head  PALPATION: Tight in the low back and the left leg  LUMBAR ROM:   Active  A/PROM  eval  Flexion WFL  Extension Decreased 25%  Right lateral flexion WFL  Left lateral flexion WFL  Right rotation   Left rotation    (Blank rows = not tested)  LOWER EXTREMITY ROM:     WFL's some clicking and popping in the left knee  LOWER EXTREMITY MMT:    MMT Right eval Left eval  Hip flexion  4+  Hip extension    Hip abduction  4+  Hip adduction    Hip internal rotation    Hip external rotation    Knee flexion  4 with pain  Knee extension  4+  Ankle dorsiflexion    Ankle plantarflexion    Ankle inversion    Ankle eversion     (Blank rows = not tested)  LUMBAR SPECIAL TESTS:  Marcello Moores test: Positive  GAIT:   No device, has mild antalgic gait on the left, reports that she was limping significantly prior to the injection   TODAY'S TREATMENT  03/07/22 NuStep L5 x 6 min Rows/lat pull down 25/35# 2x10 Shoulder extension 10# 2x10 Attempted AR press but pt complaining of L hip pain when standing Bridging with ball squeeze 2x10 L hamstring/piriformis stretch, good flexibility  overall  SLR LLE 1.5# 1x10, then with ER 2x10   SLR LLE with abduction 1.5# 1x10  02/28/22 Recumbent bike L3 x 6 min Ambulate outside around back building x2   Rows/lat pull downs 25#/35# 2x10   Lumbar flexion with black TB 2x10   AR press with 5# 2x10 each way   Stretching piriformis/hamstrings 2x30" B each   Gastroc stretch on bar 2x30"  02/21/22 NuStep L5 x 6 min, LE's only   Stretch hamstrings/piriformis 2x15" B   KTC on ball, then obliques 2x10   Rows with red TB x10, verbal cues to avoid shoulder abduction   Seated clams with green TB 2x10   Rows/lat pull downs 20#/25# 2x10   Back extension/flexion with black TB 2x10   Resisted gait 69# forward, clicking in left knee, but no pain  PATIENT EDUCATION:  Education details: None Person educated: Patient Education method: Consulting civil engineer, Demonstration, Verbal cues, and Handouts Education comprehension: verbalized understanding   HOME EXERCISE PROGRAM: Access Code: [email or web address] URL: [email or web address] Date: 02/11/2022 Prepared by: Lum Babe  Exercises - Supine Lower Trunk Rotation  - 2 x daily - 7 x weekly - 1 sets - 10 reps - 10 hold - Hooklying Single Knee to Chest  - 2 x daily - 7 x weekly - 1 sets - 10 reps - 10 hold - Supine Double Knee to Chest  - 2 x daily - 7 x weekly - 1 sets - 10 reps - 10 hold - Supine Piriformis Stretch Pulling Heel to Hip  - 2 x daily - 7 x weekly - 1 sets - 10 reps - 10 hold - Supine ITB Stretch with Strap  -  2 x daily - 7 x weekly - 1 sets - 10 reps - 10 hold - Supine Quadriceps Stretch with Strap on Table  - 2 x daily - 7 x weekly - 1 sets - 10 reps - 10 hold - Doorway Kneeling Hip Flexor Stretch  - 2 x daily - 7 x weekly - 1 sets - 10 reps - 10 hold - Standing Gastroc Stretch on Step  - 2 x daily - 7 x weekly - 1 sets - 10 reps - 10 hold  ASSESSMENT:  CLINICAL IMPRESSION:   Pt enters today having some pain in her left hip that radiates down to her knee, which is worse  than her normal pain. Says she didn't feel comfortable doing any walking outside or on treadmill today due to the pain. States she did a pure barre class on Saturday but doesn't feel like that irritated her back. We started out with some strengthening exercises and pt started to report pain in left hip and knee increasing when standing so we transitioned to supine exercises, which she tolerated well. Stated she would apply heat at home for the pain. Will benefit from additional general strengthening to decrease pain.   OBJECTIVE IMPAIRMENTS Abnormal gait, decreased activity tolerance, decreased endurance, decreased mobility, difficulty walking, decreased ROM, decreased strength, increased muscle spasms, impaired flexibility, and pain.   REHAB POTENTIAL: Good  CLINICAL DECISION MAKING: Stable/uncomplicated  EVALUATION COMPLEXITY: Low   GOALS: Goals reviewed with patient? Yes  SHORT TERM GOALS: Target date: 02/25/22  Independent with initial HEP Goal status: Met  LONG TERM GOALS: Target date: 05/06/22  Understand posture and body mechanics Goal status: Progressing   2.  No difficulty walking 20 minutes Goal status: Progressing - 02/28/22 walked 10 mins without pain   3.  Decrease pain in the leg 50% Goal status: INITIAL  4.  Independent with advanced HEP or gym Goal status: INITIAL   PLAN: PT FREQUENCY: 1-2x/week  PT DURATION: 12 weeks  PLANNED INTERVENTIONS: Therapeutic exercises, Therapeutic activity, Neuromuscular re-education, Balance training, Gait training, Patient/Family education, Joint mobilization, Dry Needling, Electrical stimulation, Spinal mobilization, Cryotherapy, Moist heat, Taping, Traction, and Manual therapy.  PLAN FOR NEXT SESSION: Assess pain, strengthening, maybe elliptical.    Naresh Althaus, SPTA 03/07/2022, 9:35 AM

## 2022-03-20 ENCOUNTER — Ambulatory Visit (INDEPENDENT_AMBULATORY_CARE_PROVIDER_SITE_OTHER): Payer: 59 | Admitting: Family Medicine

## 2022-04-02 ENCOUNTER — Encounter (INDEPENDENT_AMBULATORY_CARE_PROVIDER_SITE_OTHER): Payer: Self-pay

## 2022-04-10 ENCOUNTER — Ambulatory Visit (INDEPENDENT_AMBULATORY_CARE_PROVIDER_SITE_OTHER): Payer: 59 | Admitting: Family Medicine

## 2022-04-23 ENCOUNTER — Ambulatory Visit (INDEPENDENT_AMBULATORY_CARE_PROVIDER_SITE_OTHER): Payer: 59 | Admitting: Family Medicine

## 2024-02-22 ENCOUNTER — Emergency Department (HOSPITAL_BASED_OUTPATIENT_CLINIC_OR_DEPARTMENT_OTHER)

## 2024-02-22 ENCOUNTER — Encounter (HOSPITAL_BASED_OUTPATIENT_CLINIC_OR_DEPARTMENT_OTHER): Payer: Self-pay | Admitting: Emergency Medicine

## 2024-02-22 ENCOUNTER — Emergency Department (HOSPITAL_BASED_OUTPATIENT_CLINIC_OR_DEPARTMENT_OTHER)
Admission: EM | Admit: 2024-02-22 | Discharge: 2024-02-22 | Disposition: A | Discharge status: Home / Self Care | Attending: Emergency Medicine | Admitting: Emergency Medicine

## 2024-02-22 ENCOUNTER — Other Ambulatory Visit: Payer: Self-pay

## 2024-02-22 DIAGNOSIS — E860 Dehydration: Secondary | ICD-10-CM | POA: Insufficient documentation

## 2024-02-22 DIAGNOSIS — N132 Hydronephrosis with renal and ureteral calculous obstruction: Secondary | ICD-10-CM | POA: Diagnosis not present

## 2024-02-22 DIAGNOSIS — R109 Unspecified abdominal pain: Secondary | ICD-10-CM | POA: Diagnosis present

## 2024-02-22 DIAGNOSIS — N201 Calculus of ureter: Secondary | ICD-10-CM

## 2024-02-22 LAB — COMPREHENSIVE METABOLIC PANEL WITH GFR
ALT: 13 U/L (ref 0–44)
AST: 31 U/L (ref 15–41)
Albumin: 4.2 g/dL (ref 3.5–5.0)
Alkaline Phosphatase: 57 U/L (ref 38–126)
Anion gap: 15 (ref 5–15)
BUN: 12 mg/dL (ref 8–23)
CO2: 21 mmol/L — ABNORMAL LOW (ref 22–32)
Calcium: 9.5 mg/dL (ref 8.9–10.3)
Chloride: 103 mmol/L (ref 98–111)
Creatinine, Ser: 1.1 mg/dL — ABNORMAL HIGH (ref 0.44–1.00)
GFR, Estimated: 57 mL/min — ABNORMAL LOW (ref >60)
Glucose, Bld: 75 mg/dL (ref 70–99)
Potassium: 4 mmol/L (ref 3.5–5.1)
Sodium: 139 mmol/L (ref 135–145)
Total Bilirubin: 0.9 mg/dL (ref 0.0–1.2)
Total Protein: 7.5 g/dL (ref 6.5–8.1)

## 2024-02-22 LAB — URINALYSIS, ROUTINE W REFLEX MICROSCOPIC

## 2024-02-22 LAB — CBC
HCT: 40.1 % (ref 36.0–46.0)
Hemoglobin: 13.2 g/dL (ref 12.0–15.0)
MCH: 29.1 pg (ref 26.0–34.0)
MCHC: 32.9 g/dL (ref 30.0–36.0)
MCV: 88.3 fL (ref 80.0–100.0)
Platelets: 227 10*3/uL (ref 150–400)
RBC: 4.54 MIL/uL (ref 3.87–5.11)
RDW: 12.8 % (ref 11.5–15.5)
WBC: 4.6 10*3/uL (ref 4.0–10.5)
nRBC: 0 % (ref 0.0–0.2)

## 2024-02-22 LAB — LIPASE, BLOOD: Lipase: 36 U/L (ref 11–51)

## 2024-02-22 MED ORDER — TAMSULOSIN HCL 0.4 MG PO CAPS
0.4000 mg | ORAL_CAPSULE | Freq: Once | ORAL | Status: AC
  Administered 2024-02-22: 0.4 mg via ORAL
  Filled 2024-02-22: qty 1

## 2024-02-22 MED ORDER — TAMSULOSIN HCL 0.4 MG PO CAPS: 0.4000 mg | ORAL_CAPSULE | Freq: Every day | ORAL | 0 refills | Status: AC

## 2024-02-22 MED ORDER — KETOROLAC TROMETHAMINE 15 MG/ML IJ SOLN
15.0000 mg | Freq: Once | INTRAMUSCULAR | Status: AC
  Administered 2024-02-22: 15 mg via INTRAVENOUS
  Filled 2024-02-22: qty 1

## 2024-02-22 MED ORDER — ONDANSETRON HCL 4 MG/2ML IJ SOLN
4.0000 mg | Freq: Once | INTRAMUSCULAR | Status: AC | PRN
  Administered 2024-02-22: 4 mg via INTRAVENOUS
  Filled 2024-02-22: qty 2

## 2024-02-22 MED ORDER — OXYCODONE-ACETAMINOPHEN 5-325 MG PO TABS: 1.0000 | ORAL_TABLET | Freq: Four times a day (QID) | ORAL | 0 refills | Status: AC | PRN

## 2024-02-22 MED ORDER — LACTATED RINGERS IV BOLUS
1000.0000 mL | Freq: Once | INTRAVENOUS | Status: AC
  Administered 2024-02-22: 1000 mL via INTRAVENOUS

## 2024-02-22 NOTE — ED Triage Notes (Signed)
 Pt via pov from home with nausea since this morning. Pt reports that she was seen for a kidney stone on Sunday and released after the stone passed. She states she felt better but today has had extreme nausea. She had right flank pain this morning, which is where the kidney stone was before. She took ibuprofen for some relief, but the nausea continues and is extreme. Pt alert & oriented, nad noted.

## 2024-02-22 NOTE — Discharge Instructions (Addendum)
 You are seen in the emergency department today for concerns of nausea and flank pain.  Your CT scan shows a 4 mm stone in your right ureter close to your bladder.  This is likely the cause of the majority of your pain and the nausea you are feeling.  The stone is a size that can typically pass well on its own but I would strongly recommend that you follow-up with your primary care provider as well as urologist to ensure that the stone has fully passed.  For any concerns of worsening symptoms return to the emergency department.

## 2024-02-22 NOTE — ED Provider Notes (Signed)
 [place] EMERGENCY DEPARTMENT AT [facility] Provider Note   CSN: [id number] Arrival date & time: 02/22/24  8158     Patient presents with: Nausea   Angela Koch is a 63 y.o. female.  Presents to the emergency department with concerns of nausea and flank pain.  She states that she developed severe nausea this morning after being seen for concerns of a kidney stone yesterday while she was at an emergency department in [place] .  She states that she was told that her kidney stone had passed and she was discharged home.  She reports that she had been feeling better until the nausea kicked in.  States that the pain is in the right flank which was the same area that she was having pain when she was told she had a kidney stone.  Took naproxen earlier today with some improvement in pain.  States she has a little bit of relief with the Zofran that she received a little while ago.  Denies any fevers, dysuria or increased urinary frequency or urgency.  She does describe that her urine has become somewhat darker throughout the course of today.  HPI     Prior to Admission medications   Medication Sig Start Date End Date Taking? Authorizing Provider  oxyCODONE-acetaminophen (PERCOCET/ROXICET) 5-325 MG tablet Take 1 tablet by mouth every 6 (six) hours as needed for severe pain (pain score 7-10). 02/22/24  Yes Ngoc Daughtridge A, PA-C  tamsulosin (FLOMAX) 0.4 MG CAPS capsule Take 1 capsule (0.4 mg total) by mouth at bedtime for 15 days. 02/22/24 03/08/24 Yes Latrena Benegas A, PA-C  gabapentin (NEURONTIN) 300 MG capsule Take by mouth. 11/26/21   [provider]  ibuprofen (ADVIL,MOTRIN) 200 MG tablet Take 200 mg by mouth every 6 (six) hours as needed.    [provider]  MAGNESIUM PO Take by mouth.    [provider]  Multiple Vitamin (MULTIVITAMIN) tablet Take 1 tablet by mouth daily.    [provider]  naproxen sodium (ALEVE) 220 MG tablet Take 220 mg by mouth 2  (two) times daily as needed.    [provider]  Probiotic Product (PROBIOTIC DAILY PO) Take by mouth.    [provider]  topiramate  (TOPAMAX ) 25 MG tablet Take 1 tablet by mouth daily with lunch. 02/27/22   Opalski, Deborah, DO  VITAMIN D  PO Take by mouth.    [provider]    Allergies: Soy allergy (obsolete)    Review of Systems  Genitourinary:  Positive for flank pain.  All other systems reviewed and are negative.   Updated Vital Signs BP (!) 136/91   Pulse 80   Temp 98 F (36.7 C) (Oral)   Resp 16   Ht 5' 4 (1.626 m)   Wt 78.9 kg   LMP 01/08/2015   SpO2 96%   BMI 29.87 kg/m   Physical Exam Vitals and nursing note reviewed.  Constitutional:      General: She is not in acute distress.    Appearance: She is well-developed.  HENT:     Head: Normocephalic and atraumatic.   Eyes:     Conjunctiva/sclera: Conjunctivae normal.    Cardiovascular:     Rate and Rhythm: Normal rate and regular rhythm.     Heart sounds: No murmur heard. Pulmonary:     Effort: Pulmonary effort is normal. No respiratory distress.     Breath sounds: Normal breath sounds.  Abdominal:     Palpations: Abdomen is soft.  Tenderness: There is no abdominal tenderness. There is right CVA tenderness. There is no left CVA tenderness.   Musculoskeletal:        General: No swelling.     Cervical back: Neck supple.   Skin:    General: Skin is warm and dry.     Capillary Refill: Capillary refill takes less than 2 seconds.   Neurological:     Mental Status: She is alert.   Psychiatric:        Mood and Affect: Mood normal.     (all labs ordered are listed, but only abnormal results are displayed) Labs Reviewed  COMPREHENSIVE METABOLIC PANEL WITH GFR - Abnormal; Notable for the following components:      Result Value   CO2 21 (*)    Creatinine, Ser 1.10 (*)    GFR, Estimated 57 (*)    All other components within normal limits  URINALYSIS, ROUTINE W REFLEX  MICROSCOPIC - Abnormal; Notable for the following components:   Color, Urine AMBER (*)    APPearance TURBID (*)    Glucose, UA   (*)    Value: TEST NOT REPORTED DUE TO COLOR INTERFERENCE OF URINE PIGMENT   Hgb urine dipstick   (*)    Value: TEST NOT REPORTED DUE TO COLOR INTERFERENCE OF URINE PIGMENT   Bilirubin Urine   (*)    Value: TEST NOT REPORTED DUE TO COLOR INTERFERENCE OF URINE PIGMENT   Ketones, ur   (*)    Value: TEST NOT REPORTED DUE TO COLOR INTERFERENCE OF URINE PIGMENT   Protein, ur   (*)    Value: TEST NOT REPORTED DUE TO COLOR INTERFERENCE OF URINE PIGMENT   Nitrite   (*)    Value: TEST NOT REPORTED DUE TO COLOR INTERFERENCE OF URINE PIGMENT   Leukocytes,Ua   (*)    Value: TEST NOT REPORTED DUE TO COLOR INTERFERENCE OF URINE PIGMENT   All other components within normal limits  LIPASE, BLOOD  CBC    EKG: None  Radiology: CT Renal Stone Study Result Date: 02/22/2024 CLINICAL DATA:  Right flank pain and nausea. EXAM: CT ABDOMEN AND PELVIS WITHOUT CONTRAST TECHNIQUE: Multidetector CT imaging of the abdomen and pelvis was performed following the standard protocol without IV contrast. RADIATION DOSE REDUCTION: This exam was performed according to the departmental dose-optimization program which includes automated exposure control, adjustment of the mA and/or kV according to patient size and/or use of iterative reconstruction technique. COMPARISON:  None Available. FINDINGS: Lower chest: No acute abnormality. Hepatobiliary: No focal liver abnormality is seen. Diffusely hyperdense material is seen throughout the lumen of an otherwise normal-appearing gallbladder, likely representing vicarious contrast media excretion. No biliary dilatation is noted. Pancreas: Unremarkable. No pancreatic ductal dilatation or surrounding inflammatory changes. Spleen: Normal in size without focal abnormality. Adrenals/Urinary Tract: Adrenal glands are unremarkable. Kidneys are normal in size without  focal lesions or visible renal calculi. There is mild to moderate severity right-sided hydronephrosis and hydroureter. The distal portion of the right ureter is poorly visualized and is subsequently limited in evaluation. A 4 mm calcification is seen along the expected course of the distal right ureter, near the right UVJ (axial CT image 64, CT series 2/coronal reformatted image 51, CT series 4). The urinary bladder is empty and subsequently limited in evaluation. Stomach/Bowel: Stomach is within normal limits. Appendix appears normal. No evidence of bowel wall thickening, distention, or inflammatory changes. Vascular/Lymphatic: Mild aortic atherosclerosis. No enlarged abdominal or pelvic lymph nodes. Reproductive: Several partially  calcified and noncalcified heterogeneous uterine fibroids are seen. The bilateral adnexa are unremarkable. Other: No abdominal wall hernia or abnormality. No abdominopelvic ascites. Musculoskeletal: Moderate to marked severity degenerative changes are seen at the level of L5-S1. IMPRESSION: 1. Mild to moderate severity right-sided hydronephrosis and hydroureter, with a 4 mm distal right ureteral calculus. 2. Multiple uterine fibroids. 3. Moderate to marked severity degenerative changes at the level of L5-S1. 4. Aortic atherosclerosis. Electronically Signed   By: Suzen Dials M.D.   On: 02/22/2024 22:56     Procedures   Medications Ordered in the ED  ondansetron [facility]) injection 4 mg (4 mg Intravenous Given 02/22/24 1906)  lactated ringers bolus 1,000 mL (1,000 mLs Intravenous New Bag/Given 02/22/24 2245)  ketorolac (TORADOL) 15 MG/ML injection 15 mg (15 mg Intravenous Given 02/22/24 2243)  tamsulosin (FLOMAX) capsule 0.4 mg (0.4 mg Oral Given 02/22/24 2341)                                    Medical Decision Making Amount and/or Complexity of Data Reviewed Labs: ordered. Radiology: ordered.  Risk Prescription drug management.   This patient presents to the ED  for concern of nausea, and pain.  Differential diagnosis includes pyelonephritis, urolithiasis, bowel obstruction, diverticulitis   Lab Tests:  I Ordered, and personally interpreted labs.  The pertinent results include: CBC shows no notable leukocytosis, CMP with mild evidence of dehydration with creatinine elevated at 1.1 and GFR 57, lipase unremarkable 36, UA unable to be deciphered due to blood present   Imaging Studies ordered:  I ordered imaging studies including CT renal stone study I independently visualized and interpreted imaging which showed 4 mm distal calculus of the right ureter with mild to moderate hydronephrosis I agree with the radiologist interpretation   Medicines ordered and prescription drug management:  I ordered medication including fluid bolus, Toradol, Zofran for dehydration, pain, nausea Reevaluation of the patient after these medicines showed that the patient improved I have reviewed the patients home medicines and have made adjustments as needed   Problem List / ED Course:  Patient presents to the emergency department concerns of nausea and flank pain.  History significant for chronic low back pain.  She states that she was seen at an emergency department in [place]  last night/yesterday and diagnosed with a kidney stone.  Discharged home and states that she had been stable but ended up with worsening nausea this morning and several episodes of vomiting.  Not tolerating p.o. over the last 2 days due to her symptoms. On exam, patient is well-appearing although does appear uncomfortable.  Normal bowel sounds and no abnormal bowel findings.  The right flank has mild CVA tenderness.  Capillary refill less than 2 seconds.  Given area of pain, repeat labs, urine, and CT renal stone study.  Toradol, Zofran, and fluid bolus initiated for management of symptoms.  Will reassess shortly. On reassessment, patient reports improvement in symptoms with Toradol, Zofran, and  fluids.  CT renal stone study concerning for 4 mm stone in the distal right ureter with mild to moderate hydronephrosis.  No obvious evidence to suggest infected stone on urinalysis but patient was started on Keflex 4 times daily by emergency department initially saw her in [place] .  With a normal white count, do not feel that this is urosepsis.  Will start patient on Flomax for additional management of symptoms and discharged with a prescription for Percocet  for pain control.  Patient previously received Zofran prescription and has been using this at home.  Advise return precautions such as concerns for new or worsening symptoms.  Encourage close follow-up with urology for further management as needed.  Otherwise stable at this time for outpatient follow-up and discharged home.  Final diagnoses:  Calculus of ureter  Dehydration    ED Discharge Orders          Ordered    oxyCODONE-acetaminophen (PERCOCET/ROXICET) 5-325 MG tablet  Every 6 hours PRN        02/22/24 2342    tamsulosin (FLOMAX) 0.4 MG CAPS capsule  Daily at bedtime        02/22/24 2342               Wenzel Backlund A, PA-C 02/22/24 2346    Towana Ozell BROCKS, MD 02/23/24 1014
